# Patient Record
Sex: Female | Born: 1955 | Race: White | Hispanic: No | Smoking: Former smoker
Health system: Southern US, Community
[De-identification: ages and names within clinical notes are randomized; demographics above are authoritative.]

## PROBLEM LIST (undated history)

## (undated) DIAGNOSIS — E119 Type 2 diabetes mellitus without complications: Secondary | ICD-10-CM

## (undated) DIAGNOSIS — K635 Polyp of colon: Secondary | ICD-10-CM

## (undated) DIAGNOSIS — Z972 Presence of dental prosthetic device (complete) (partial): Secondary | ICD-10-CM

## (undated) DIAGNOSIS — I10 Essential (primary) hypertension: Secondary | ICD-10-CM

## (undated) DIAGNOSIS — E785 Hyperlipidemia, unspecified: Secondary | ICD-10-CM

## (undated) DIAGNOSIS — F419 Anxiety disorder, unspecified: Secondary | ICD-10-CM

## (undated) DIAGNOSIS — M1990 Unspecified osteoarthritis, unspecified site: Secondary | ICD-10-CM

## (undated) DIAGNOSIS — G4730 Sleep apnea, unspecified: Secondary | ICD-10-CM

## (undated) HISTORY — DX: Type 2 diabetes mellitus without complications: E11.9

## (undated) HISTORY — PX: HYSTERECTOMY ABDOMINAL WITH SALPINGECTOMY: SHX6725

## (undated) HISTORY — DX: Essential (primary) hypertension: I10

## (undated) HISTORY — DX: Hyperlipidemia, unspecified: E78.5

## (undated) HISTORY — DX: Anxiety disorder, unspecified: F41.9

---

## 2004-08-08 ENCOUNTER — Other Ambulatory Visit: Payer: Self-pay

## 2005-09-13 ENCOUNTER — Emergency Department: Payer: Self-pay | Admitting: Emergency Medicine

## 2007-07-05 ENCOUNTER — Emergency Department: Payer: Self-pay | Admitting: Emergency Medicine

## 2008-03-15 ENCOUNTER — Ambulatory Visit: Payer: Self-pay | Admitting: Family Medicine

## 2008-04-06 ENCOUNTER — Emergency Department: Payer: Self-pay | Admitting: Emergency Medicine

## 2009-06-22 ENCOUNTER — Emergency Department: Payer: Self-pay | Admitting: Internal Medicine

## 2009-10-11 ENCOUNTER — Ambulatory Visit: Payer: Self-pay | Admitting: General Practice

## 2010-03-18 ENCOUNTER — Ambulatory Visit: Payer: Self-pay | Admitting: Family Medicine

## 2010-07-21 ENCOUNTER — Emergency Department: Payer: Self-pay | Admitting: Emergency Medicine

## 2011-03-27 ENCOUNTER — Ambulatory Visit: Payer: Self-pay | Admitting: Family Medicine

## 2011-06-25 ENCOUNTER — Ambulatory Visit: Payer: Self-pay | Admitting: General Practice

## 2011-08-13 DIAGNOSIS — Z9079 Acquired absence of other genital organ(s): Secondary | ICD-10-CM

## 2011-08-13 DIAGNOSIS — F329 Major depressive disorder, single episode, unspecified: Secondary | ICD-10-CM | POA: Insufficient documentation

## 2011-08-13 DIAGNOSIS — J309 Allergic rhinitis, unspecified: Secondary | ICD-10-CM

## 2011-08-13 DIAGNOSIS — Z90722 Acquired absence of ovaries, bilateral: Secondary | ICD-10-CM

## 2011-08-13 DIAGNOSIS — E669 Obesity, unspecified: Secondary | ICD-10-CM | POA: Insufficient documentation

## 2011-08-13 DIAGNOSIS — Z90710 Acquired absence of both cervix and uterus: Secondary | ICD-10-CM | POA: Insufficient documentation

## 2011-08-13 DIAGNOSIS — H9319 Tinnitus, unspecified ear: Secondary | ICD-10-CM

## 2011-08-13 DIAGNOSIS — F32A Depression, unspecified: Secondary | ICD-10-CM | POA: Insufficient documentation

## 2011-08-13 DIAGNOSIS — E785 Hyperlipidemia, unspecified: Secondary | ICD-10-CM | POA: Insufficient documentation

## 2011-08-13 DIAGNOSIS — I10 Essential (primary) hypertension: Secondary | ICD-10-CM | POA: Insufficient documentation

## 2011-08-13 DIAGNOSIS — F419 Anxiety disorder, unspecified: Secondary | ICD-10-CM | POA: Insufficient documentation

## 2011-08-13 HISTORY — DX: Tinnitus, unspecified ear: H93.19

## 2011-08-13 HISTORY — DX: Acquired absence of both cervix and uterus: Z90.710

## 2011-08-13 HISTORY — DX: Allergic rhinitis, unspecified: J30.9

## 2012-02-19 DIAGNOSIS — E119 Type 2 diabetes mellitus without complications: Secondary | ICD-10-CM | POA: Insufficient documentation

## 2012-03-08 DIAGNOSIS — M25519 Pain in unspecified shoulder: Secondary | ICD-10-CM | POA: Insufficient documentation

## 2012-03-09 DIAGNOSIS — M7530 Calcific tendinitis of unspecified shoulder: Secondary | ICD-10-CM | POA: Insufficient documentation

## 2012-10-30 ENCOUNTER — Emergency Department: Payer: Self-pay | Admitting: Emergency Medicine

## 2012-10-30 LAB — RAPID INFLUENZA A&B ANTIGENS

## 2012-12-28 HISTORY — PX: COLPOSCOPY: SHX161

## 2013-05-16 DIAGNOSIS — S63501A Unspecified sprain of right wrist, initial encounter: Secondary | ICD-10-CM | POA: Insufficient documentation

## 2015-01-11 DIAGNOSIS — M67461 Ganglion, right knee: Secondary | ICD-10-CM | POA: Insufficient documentation

## 2015-01-11 HISTORY — DX: Ganglion, right knee: M67.461

## 2015-08-28 DIAGNOSIS — G4733 Obstructive sleep apnea (adult) (pediatric): Secondary | ICD-10-CM

## 2015-08-28 HISTORY — DX: Obstructive sleep apnea (adult) (pediatric): G47.33

## 2015-10-24 ENCOUNTER — Ambulatory Visit: Payer: Self-pay | Admitting: Physician Assistant

## 2015-10-24 VITALS — BP 110/70 | HR 82 | Temp 98.1°F

## 2015-10-24 DIAGNOSIS — B029 Zoster without complications: Secondary | ICD-10-CM

## 2015-10-24 MED ORDER — FAMCICLOVIR 500 MG PO TABS: 500.0000 mg | ORAL_TABLET | Freq: Three times a day (TID) | ORAL | Status: DC

## 2015-10-24 MED ORDER — METHYLPREDNISOLONE 4 MG PO TBPK: ORAL_TABLET | ORAL | Status: DC

## 2015-10-24 MED ORDER — HYDROCODONE-ACETAMINOPHEN 5-325 MG PO TABS: 1.0000 | ORAL_TABLET | Freq: Four times a day (QID) | ORAL | Status: DC | PRN

## 2015-10-24 NOTE — Patient Instructions (Signed)
Shingles Shingles is an infection that causes a painful skin rash and fluid-filled blisters. Shingles is caused by the same virus that causes chickenpox. Shingles only develops in people who:  Have had chickenpox.  Have gotten the chickenpox vaccine. (This is rare.) The first symptoms of shingles may be itching, tingling, or pain in an area on your skin. A rash will follow in a few days or weeks. The rash is usually on one side of the body in a bandlike or beltlike pattern. Over time, the rash turns into fluid-filled blisters that break open, scab over, and dry up. Medicines may:  Help you manage pain.  Help you recover more quickly.  Help to prevent long-term problems. HOME CARE Medicines  Take medicines only as told by your doctor.  Apply an anti-itch or numbing cream to the affected area as told by your doctor. Blister and Rash Care  Take a cool bath or put cool compresses on the area of the rash or blisters as told by your doctor. This may help with pain and itching.  Keep your rash covered with a loose bandage (dressing). Wear loose-fitting clothing.  Keep your rash and blisters clean with mild soap and cool water or as told by your doctor.  Check your rash every day for signs of infection. These include redness, swelling, and pain that lasts or gets worse.  Do not pick your blisters.  Do not scratch your rash. General Instructions  Rest as told by your doctor.  Keep all follow-up visits as told by your doctor. This is important.  Until your blisters scab over, your infection can cause chickenpox in people who have never had it or been vaccinated against it. To prevent this from happening, avoid touching other people or being around other people, especially:  Babies.  Pregnant women.  Children who have eczema.  Elderly people who have transplants.  People who have chronic illnesses, such as leukemia or AIDS. GET HELP IF:  Your pain does not get better with  medicine.  Your pain does not get better after the rash heals.  Your rash looks infected. Signs of infection include:  Redness.  Swelling.  Pain that lasts or gets worse. GET HELP RIGHT AWAY IF:  The rash is on your face or nose.  You have pain in your face, pain around your eye area, or loss of feeling on one side of your face.  You have ear pain or you have ringing in your ear.  You have loss of taste.  Your condition gets worse.   This information is not intended to replace advice given to you by your health care provider. Make sure you discuss any questions you have with your health care provider.   Document Released: 06/01/2008 Document Revised: 01/04/2015 Document Reviewed: 09/25/2014 Elsevier Interactive Patient Education 2016 [employer]  

## 2015-10-24 NOTE — Progress Notes (Signed)
S: c/o rash on left buttock, painful with blisters, noticed yesterday, no fever/chills, hx of chicken pox as a kid  O: vitals wnl, nad, skin with cluster of blisters on erythematous base on left buttock, no drainage, nv intact, lungs c t a, cv rrr  A: shingles  P: famvir 500mg  tid x7d, medrol dose pack, vicodin 5/325 #20 nr

## 2016-11-13 ENCOUNTER — Ambulatory Visit: Payer: Self-pay | Admitting: Registered Nurse

## 2016-11-13 ENCOUNTER — Encounter: Payer: Self-pay | Admitting: Registered Nurse

## 2016-11-13 ENCOUNTER — Other Ambulatory Visit: Payer: Self-pay | Admitting: Registered Nurse

## 2016-11-13 VITALS — BP 120/80 | HR 84 | Temp 98.8°F

## 2016-11-13 DIAGNOSIS — J209 Acute bronchitis, unspecified: Secondary | ICD-10-CM

## 2016-11-13 DIAGNOSIS — J038 Acute tonsillitis due to other specified organisms: Secondary | ICD-10-CM

## 2016-11-13 DIAGNOSIS — J019 Acute sinusitis, unspecified: Secondary | ICD-10-CM

## 2016-11-13 DIAGNOSIS — H6593 Unspecified nonsuppurative otitis media, bilateral: Secondary | ICD-10-CM

## 2016-11-13 MED ORDER — MOMETASONE FUROATE 50 MCG/ACT NA SUSP: 2.0000 | Freq: Every day | NASAL | 0 refills | Status: DC

## 2016-11-13 MED ORDER — IBUPROFEN 600 MG PO TABS: 600.0000 mg | ORAL_TABLET | Freq: Three times a day (TID) | ORAL | 0 refills | Status: DC | PRN

## 2016-11-13 MED ORDER — SALINE SPRAY 0.65 % NA SOLN: 1.0000 | NASAL | 0 refills | Status: DC | PRN

## 2016-11-13 MED ORDER — BENZONATATE 200 MG PO CAPS: 200.0000 mg | ORAL_CAPSULE | Freq: Three times a day (TID) | ORAL | 0 refills | Status: AC | PRN

## 2016-11-13 MED ORDER — AMOXICILLIN-POT CLAVULANATE 875-125 MG PO TABS: 1.0000 | ORAL_TABLET | Freq: Two times a day (BID) | ORAL | 0 refills | Status: AC

## 2016-11-13 MED ORDER — ACETAMINOPHEN 500 MG PO TABS: 1000.0000 mg | ORAL_TABLET | Freq: Four times a day (QID) | ORAL | 0 refills | Status: AC | PRN

## 2016-11-13 MED ORDER — FEXOFENADINE HCL 180 MG PO TABS: 180.0000 mg | ORAL_TABLET | Freq: Every day | ORAL | Status: DC

## 2016-11-13 NOTE — Progress Notes (Signed)
Supportive treatment.   No evidence of invasive bacterial infection, non toxic and well hydrated.  This is most likely self limiting viral infection.  I do not see where any further testing or imaging is necessary at this time.   I will suggest supportive care, rest, good hygiene and encourage the patient to take adequate fluids.  The patient is to return to clinic or EMERGENCY ROOM if symptoms worsen or change significantly e.g. ear pain, fever, purulent discharge from ears or bleeding.  Exitcare handout on otitis media with effusion given to patient.  Patient verbalized agreement and understanding of treatment plan.    Patient notified rapid strep negative.  Suspect Viral illness: no evidence of invasive bacterial infection, non toxic and well hydrated.  This is most likely self limiting viral infection.  I do not see where any further testing or imaging is necessary at this time.   I will suggest supportive care, rest, good hygiene and encourage the patient to take adequate fluids.  Does not require work excuse.  Notified patient staff will call with culture results once available next 48+ hours.  Sudafed 30mg  po q4-6h prn; flonase 1 spray each nostril BID prn, nasal saline 1-2 sprays each nostril prn q2h, motrin 800mg  po TID prn.  Discussed honey with lemon and salt water gargles for comfort also.  The patient is to return to clinic or EMERGENCY ROOM if symptoms worsen or change significantly e.g. fever, lethargy, SOB, wheezing.  Exitcare handout on viral illness given to patient.  Patient verbalized agreement and understanding of treatment plan.    No evidence of systemic bacterial infection, non toxic and well hydrated.  I do not see where any further testing or imaging is necessary at this time.   I will suggest supportive care, rest, good hygiene and encourage the patient to take adequate fluids.  The patient is to return to clinic or EMERGENCY ROOM if symptoms worsen or change significantly.  Exitcare  handout on sinusitis given to patient.  Patient verbalized agreement and understanding of treatment plan and had no further questions at this time.   P2:  Hand washing and cover cough  Restart flonase 1 spray each nostril BID, saline 2 sprays each nostril q2h prn congestion.  If no improvement with 48 hours of saline and flonase use start augmentin 875mg  po BID x 10 days.  Rx given.  No evidence of systemic bacterial infection, non toxic and well hydrated.  I do not see where any further testing or imaging is necessary at this time.   I will suggest supportive care, rest, good hygiene and encourage the patient to take adequate fluids.  The patient is to return to clinic or EMERGENCY ROOM if symptoms worsen or change significantly.  Exitcare handout on sinusitis given to patient.  Patient verbalized agreement and understanding of treatment plan and had no further questions at this time.   P2:  Hand washing and cover cough  Bronchitis simple, community acquired, may have started as viral (probably respiratory syncytial, parainfluenza, influenza, or adenovirus), but now evidence of acute purulent bronchitis with resultant bronchial edema and mucus formation.  Viruses are the most common cause of bronchial inflammation in otherwise healthy adults with acute bronchitis.  The appearance of sputum is not predictive of whether a bacterial infection is present.  Purulent sputum is most often caused by viral infections.  There are a small portion of those caused by non-viral agents being Mycoplamsa pneumonia.  Microscopic examination or C&S of sputum  in the healthy adult with acute bronchitis is generally not helpful (usually negative or normal respiratory flora) other considerations being cough from upper respiratory tract infections, sinusitis or allergic syndromes (mild asthma or viral pneumonia).  Differential Diagnosis:  reactive airway disease (asthma, allergic aspergillosis (eosinophilia), chronic bronchitis,  respiratory infection (Sinusitis, Common cold, pneumonia), congestive heart failure, reflux esophagitis, bronchogenic tumor, aspiration syndromes and/or exposure irritants/tobacco smoke.  In this case, there is no evidence of any invasive bacterial illness.  Most likely viral etiology so will hold on antibiotic treatment.  Advise supportive care with rest, encourage fluids, good hygiene and watch for any worsening symptoms.  If they were to develop:  come back to the office or go to the emergency room if after hours. Without high fever, severe dyspnea, lack of physical findings or other risk factors, I will hold on a chest radiograph and CBC at this time. I discussed that approximately 50% of patients with acute bronchitis have a cough that lasts up to three weeks, and 25% for over a month.  Tylenol, one to two tablets every four hours as needed for fever or myalgias.   No aspirin.  Patient instructed to follow up in one week or sooner if symptoms worsen. Patient verbalized agreement and understanding of treatment plan.  P2:  hand washing and cover cough  School/work excuse note given to patient for 24 hours.  Usually no specific medical treatment is needed if a virus is causing the sore throat.  The throat most often gets better on its own within 5 to 7 days.  Antibiotic medicine does not cure viral pharyngitis.   For acute pharyngitis caused by bacteria, your healthcare provider will prescribe an antibiotic.  Marland Kitchen Do not smoke.  Marland Kitchen Avoid secondhand smoke and other air pollutants.  . Use a cool mist humidifier to add moisture to the air.  . Get plenty of rest.  . You may want to rest your throat by talking less and eating a diet that is mostly liquid or soft for a day or two.   Marland Kitchen Nonprescription throat lozenges and mouthwashes should help relieve the soreness.   . Gargling with warm saltwater and drinking warm liquids may help.  (You can make a saltwater solution by adding 1/4 teaspoon of salt to 8 ounces,  or 240 mL, of warm water.)  . A nonprescription pain reliever such as aspirin, acetaminophen, or ibuprofen may ease general aches and pains.   FOLLOW UP with clinic provider if no improvements in the next 7-10 days.  Patient verbalized understanding of instructions and agreed with plan of care.

## 2016-11-13 NOTE — Progress Notes (Signed)
Subjective:    Patient ID: Katherine Francis, female    DOB: 1956/10/22, 60 y.o.   MRN: [id number]  Married caucasian female recently traveling returned home with cough, post nasal drip, sore throat, pressure behind eyes.  Stopped metformin 3 weeks ago along with citalopram states blood sugars on glucometer running 114 when last checked.  Denied fever/chills.  Productive cough green with blood tinged.  Ran out of mometasone nasal spray needs refill.  Has been trying dayquil and nyquil without much relief of symptoms.  Ear pressure.  Stated she usually takes her depression medications when at home spouse makes her depressed      Review of Systems  Constitutional: Positive for fatigue. Negative for activity change, appetite change, chills, diaphoresis, fever and unexpected weight change.  HENT: Positive for congestion, ear pain, nosebleeds, postnasal drip, rhinorrhea, sinus pain, sinus pressure and sore throat. Negative for dental problem, drooling, ear discharge, facial swelling, hearing loss, mouth sores, sneezing, tinnitus, trouble swallowing and voice change.   Eyes: Negative for photophobia, pain, discharge, redness, itching and visual disturbance.  Respiratory: Positive for cough. Negative for choking, chest tightness, shortness of breath, wheezing and stridor.   Cardiovascular: Negative for chest pain, palpitations and leg swelling.  Gastrointestinal: Negative for abdominal distention, abdominal pain, blood in stool, constipation, diarrhea, nausea and vomiting.  Endocrine: Negative for cold intolerance and heat intolerance.  Genitourinary: Negative for difficulty urinating, dysuria and hematuria.  Musculoskeletal: Negative for arthralgias, back pain, gait problem, joint swelling, myalgias, neck pain and neck stiffness.  Skin: Negative for color change, pallor, rash and wound.  Allergic/Immunologic: Positive for environmental allergies. Negative for food allergies.  Neurological: Positive  for headaches. Negative for dizziness, tremors, seizures, syncope, facial asymmetry, speech difficulty, weakness, light-headedness and numbness.  Hematological: Negative for adenopathy. Does not bruise/bleed easily.  Psychiatric/Behavioral: Positive for sleep disturbance. Negative for agitation, behavioral problems and confusion.       Objective:   Physical Exam  Constitutional: She is oriented to person, place, and time. Vital signs are normal. She appears well-developed and well-nourished. She is active and cooperative.  Non-toxic appearance. She does not have a sickly appearance. She appears ill. No distress.  HENT:  Head: Normocephalic and atraumatic.  Right Ear: Hearing, external ear and ear canal normal. A middle ear effusion is present.  Left Ear: Hearing, external ear and ear canal normal. A middle ear effusion is present.  Nose: Mucosal edema and rhinorrhea present. No nose lacerations, sinus tenderness, nasal deformity, septal deviation or nasal septal hematoma. No epistaxis.  No foreign bodies. Right sinus exhibits no maxillary sinus tenderness and no frontal sinus tenderness. Left sinus exhibits no maxillary sinus tenderness and no frontal sinus tenderness.  Mouth/Throat: Uvula is midline and mucous membranes are normal. Mucous membranes are not pale, not dry and not cyanotic. She does not have dentures. No oral lesions. No trismus in the jaw. Normal dentition. No dental abscesses, uvula swelling, lacerations or dental caries. Posterior oropharyngeal edema and posterior oropharyngeal erythema present. No oropharyngeal exudate or tonsillar abscesses.  Right tonsil edema 3+ touching uvula no exudate, cobblestoning posterior pharynx; bilateral nasal turbinates edema/erythema, clear discharge; bilateral allergic shiners, intermittent nonproductive cough; bilateral TMs air fluid level 50% opacity  Eyes: Conjunctivae, EOM and lids are normal. Pupils are equal, round, and reactive to light.  Right eye exhibits no chemosis, no discharge, no exudate and no hordeolum. No foreign body present in the right eye. Left eye exhibits no chemosis, no discharge, no exudate and no  hordeolum. No foreign body present in the left eye. Right conjunctiva is not injected. Right conjunctiva has no hemorrhage. Left conjunctiva is not injected. Left conjunctiva has no hemorrhage. No scleral icterus. Right eye exhibits normal extraocular motion and no nystagmus. Left eye exhibits normal extraocular motion and no nystagmus. Right pupil is round and reactive. Left pupil is round and reactive. Pupils are equal.  Neck: Trachea normal and normal range of motion. Neck supple. No tracheal tenderness, no spinous process tenderness and no muscular tenderness present. No neck rigidity. No tracheal deviation, no edema, no erythema and normal range of motion present. No thyroid mass and no thyromegaly present.  Cardiovascular: Normal rate, regular rhythm, S1 normal, S2 normal, normal heart sounds and intact distal pulses.  PMI is not displaced.  Exam reveals no gallop and no friction rub.   No murmur heard. Pulmonary/Chest: Effort normal and breath sounds normal. No accessory muscle usage or stridor. No respiratory distress. She has no decreased breath sounds. She has no wheezes. She has no rhonchi. She has no rales. She exhibits no tenderness.  Abdominal: Soft. Normal appearance. She exhibits no distension.  Musculoskeletal: Normal range of motion. She exhibits no edema or tenderness.       Right shoulder: Normal.       Left shoulder: Normal.       Right hip: Normal.       Left hip: Normal.       Right knee: Normal.       Left knee: Normal.       Cervical back: Normal.       Right hand: Normal.       Left hand: Normal.  Lymphadenopathy:       Head (right side): No submental, no submandibular, no tonsillar, no preauricular, no posterior auricular and no occipital adenopathy present.       Head (left side): No submental,  no submandibular, no tonsillar, no preauricular, no posterior auricular and no occipital adenopathy present.    She has no cervical adenopathy.       Right cervical: No superficial cervical, no deep cervical and no posterior cervical adenopathy present.      Left cervical: No superficial cervical, no deep cervical and no posterior cervical adenopathy present.  Neurological: She is alert and oriented to person, place, and time. She has normal strength. She is not disoriented. She displays no atrophy and no tremor. No cranial nerve deficit or sensory deficit. She exhibits normal muscle tone. She displays no seizure activity. Coordination and gait normal. GCS eye subscore is 4. GCS verbal subscore is 5. GCS motor subscore is 6.  Skin: Skin is warm, dry and intact. No abrasion, no bruising, no burn, no ecchymosis, no laceration, no lesion, no petechiae and no rash noted. She is not diaphoretic. No cyanosis or erythema. No pallor. Nails show no clubbing.  Psychiatric: She has a normal mood and affect. Her speech is normal and behavior is normal. Judgment and thought content normal. Cognition and memory are normal.  Nursing note and vitals reviewed.         Assessment & Plan:  A-acutre rhinosinusitis, bilateral otitis media effusion, acute bronchitis viral  P-augmentin 875mg  po BID x 10 days; restart mometasone 2 sprays each nostril daily, tessalon pearles 200mg  po TID prn cough, tylenol 1000mg  po QID prn pain/fever, restart allegra 180mg  po daily at home.  Aggressive use nasal saline 2 sprays each nostril q2h prn congestion.   Patient may use normal saline nasal spray  as needed.  Consider antihistamine or nasal steroid use.  Avoid triggers if possible.  Shower prior to bedtime if exposed to triggers.  If allergic dust/dust mites recommend mattress/pillow covers/encasements; washing linens, vacuuming, sweeping, dusting weekly.  Call or return to clinic as needed if these symptoms worsen or fail to  improve as anticipated.   Patient verbalized understanding of instructions, agreed with plan of care and had no further questions at this time.  P2:  Avoidance and hand washing.  Supportive treatment.   No evidence of invasive bacterial infection, non toxic and well hydrated.  This is most likely self limiting viral infection.  I do not see where any further testing or imaging is necessary at this time.   I will suggest supportive care, rest, good hygiene and encourage the patient to take adequate fluids.  The patient is to return to clinic or EMERGENCY ROOM if symptoms worsen or change significantly e.g. ear pain, fever, purulent discharge from ears or bleeding.   Patient verbalized agreement and understanding of treatment plan.      Suspect Viral illness: no evidence of invasive bacterial infection, non toxic and well hydrated.  This is most likely self limiting viral infection.  I do not see where any further testing or imaging is necessary at this time.   I will suggest supportive care, rest, good hygiene and encourage the patient to take adequate fluids.  Does not require work excuse.   nasal saline 1-2 sprays each nostril prn q2h, motrin 800mg  po TID prn.  Discussed honey with lemon and salt water gargles for comfort also.  The patient is to return to clinic or EMERGENCY ROOM if symptoms worsen or change significantly e.g. fever, lethargy, SOB, wheezing.    Patient verbalized agreement and understanding of treatment plan.    Restart mometasone 2 sprays each nostril daily (refilled Rx), saline 2 sprays each nostril q2h prn congestion.  If no improvement with 48 hours of saline and mometasone use start augmentin 875mg  po BID x 10 days.  Rx given.  No evidence of systemic bacterial infection, non toxic and well hydrated.  I do not see where any further testing or imaging is necessary at this time.   I will suggest supportive care, rest, good hygiene and encourage the patient to take adequate fluids.  The  patient is to return to clinic or EMERGENCY ROOM if symptoms worsen or change significantly.   Patient verbalized agreement and understanding of treatment plan and had no further questions at this time.   P2:  Hand washing and cover cough  Tessalon pearles 200mg  po TID prn cough.  Augmentin Rx for pharyngitis/sinusitis will cover for bronchitis also.  Bronchitis simple, community acquired, may have started as viral (probably respiratory syncytial, parainfluenza, influenza, or adenovirus), but now evidence of acute purulent bronchitis with resultant bronchial edema and mucus formation.  Viruses are the most common cause of bronchial inflammation in otherwise healthy adults with acute bronchitis.  The appearance of sputum is not predictive of whether a bacterial infection is present.  Purulent sputum is most often caused by viral infections.  There are a small portion of those caused by non-viral agents being Mycoplamsa pneumonia.  Microscopic examination or C&S of sputum in the healthy adult with acute bronchitis is generally not helpful (usually negative or normal respiratory flora) other considerations being cough from upper respiratory tract infections, sinusitis or allergic syndromes (mild asthma or viral pneumonia).  Differential Diagnosis:  reactive airway disease (asthma, allergic aspergillosis (eosinophilia),  chronic bronchitis, respiratory infection (Sinusitis, Common cold, pneumonia), congestive heart failure, reflux esophagitis, bronchogenic tumor, aspiration syndromes and/or exposure irritants/tobacco smoke.  In this case, there is no evidence of any invasive bacterial illness.  Most likely viral etiology so will hold on antibiotic treatment.  Advise supportive care with rest, encourage fluids, good hygiene and watch for any worsening symptoms.  If they were to develop:  come back to the office or go to the emergency room if after hours. Without high fever, severe dyspnea, lack of physical findings or  other risk factors, I will hold on a chest radiograph and CBC at this time. I discussed that approximately 50% of patients with acute bronchitis have a cough that lasts up to three weeks, and 25% for over a month.  Tylenol, one to two tablets every four hours as needed for fever or myalgias.   No aspirin.  Patient instructed to follow up in one week or sooner if symptoms worsen. Patient verbalized agreement and understanding of treatment plan.  P2:  hand washing and cover cough  Did not require work excuse.  Rx augmentin 875mg  po BID x 10 days will cover for strep throat.  Patient has tylenol and motrin at home for prn use.  Usually no specific medical treatment is needed if a virus is causing the sore throat.  The throat most often gets better on its own within 5 to 7 days.  Antibiotic medicine does not cure viral pharyngitis.   For acute pharyngitis caused by bacteria, your healthcare provider will prescribe an antibiotic.  Marland Kitchen Do not smoke.  Marland Kitchen Avoid secondhand smoke and other air pollutants.  . Use a cool mist humidifier to add moisture to the air.  . Get plenty of rest.  . You may want to rest your throat by talking less and eating a diet that is mostly liquid or soft for a day or two.   Marland Kitchen Nonprescription throat lozenges and mouthwashes should help relieve the soreness.   . Gargling with warm saltwater and drinking warm liquids may help.  (You can make a saltwater solution by adding 1/4 teaspoon of salt to 8 ounces, or 240 mL, of warm water.)  . A nonprescription pain reliever such as aspirin, acetaminophen, or ibuprofen may ease general aches and pains.   FOLLOW UP with clinic provider if no improvements in the next 7-10 days.  Patient verbalized understanding of instructions and agreed with plan of care.

## 2016-11-30 ENCOUNTER — Other Ambulatory Visit: Payer: Self-pay | Admitting: Family Medicine

## 2016-11-30 DIAGNOSIS — Z1239 Encounter for other screening for malignant neoplasm of breast: Secondary | ICD-10-CM

## 2016-12-01 ENCOUNTER — Ambulatory Visit: Payer: Self-pay | Admitting: Physician Assistant

## 2016-12-01 VITALS — BP 131/79 | HR 75 | Temp 97.9°F

## 2016-12-01 DIAGNOSIS — J069 Acute upper respiratory infection, unspecified: Secondary | ICD-10-CM

## 2016-12-01 MED ORDER — AZITHROMYCIN 250 MG PO TABS: ORAL_TABLET | ORAL | 0 refills | Status: DC

## 2016-12-01 MED ORDER — PREDNISONE 10 MG PO TABS: 30.0000 mg | ORAL_TABLET | Freq: Every day | ORAL | 0 refills | Status: DC

## 2016-12-01 NOTE — Progress Notes (Signed)
S: C/o runny nose, sore throat, and congestion for 2 weeks, no fever, chills, cp/sob, v/d; mucus is green and thick, cough is sporadic, c/o of facial and dental pain. Also some headache and neck pain, finished amoxil but doesn't think it really worked, usually does well with zpacks  Using otc meds:   O: PE: vitals wnl, nad,  perrl eomi, normocephalic, tms dull, nasal mucosa red and swollen, throat injected, neck supple no lymph, lungs c t a, cv rrr, neuro intact  A:  Acute sinusitis   P: drink fluids, continue regular meds , use otc meds of choice, return if not improving in 5 days, return earlier if worsening, zpack, pred 30 mg qd x 3d,

## 2016-12-31 ENCOUNTER — Other Ambulatory Visit: Payer: Self-pay | Admitting: Emergency Medicine

## 2016-12-31 MED ORDER — MOMETASONE FUROATE 50 MCG/ACT NA SUSP: 2.0000 | Freq: Every day | NASAL | 6 refills | Status: DC

## 2016-12-31 NOTE — Telephone Encounter (Signed)
Med refill for nasonex approved

## 2018-03-21 ENCOUNTER — Encounter: Payer: Self-pay | Admitting: Obstetrics and Gynecology

## 2018-03-21 ENCOUNTER — Ambulatory Visit (INDEPENDENT_AMBULATORY_CARE_PROVIDER_SITE_OTHER): Payer: Managed Care, Other (non HMO) | Admitting: Obstetrics and Gynecology

## 2018-03-21 VITALS — BP 140/88 | HR 95 | Ht 62.0 in | Wt 174.0 lb

## 2018-03-21 DIAGNOSIS — N898 Other specified noninflammatory disorders of vagina: Secondary | ICD-10-CM

## 2018-03-21 DIAGNOSIS — B356 Tinea cruris: Secondary | ICD-10-CM | POA: Diagnosis not present

## 2018-03-21 LAB — POCT WET PREP WITH KOH
Clue Cells Wet Prep HPF POC: NEGATIVE
KOH PREP POC: NEGATIVE
Trichomonas, UA: NEGATIVE
Yeast Wet Prep HPF POC: NEGATIVE

## 2018-03-21 MED ORDER — CLOTRIMAZOLE-BETAMETHASONE 1-0.05 % EX CREA: 1.0000 "application " | TOPICAL_CREAM | Freq: Two times a day (BID) | CUTANEOUS | 0 refills | Status: DC

## 2018-03-21 NOTE — Progress Notes (Signed)
Patient, No Pcp Per   Chief Complaint  Patient presents with  . Vaginitis    itching, burning and tingling in leg creases     HPI:      [name] is a 62 y.o. No obstetric history on file. who LMP was No LMP recorded. Patient is postmenopausal., presents today for NP eval of vaginal itching and burning for several months. She notices it in crease of inner thighs and rectal area, as well as clitoral hood. She has treated with OTC meds without relief, as well as a little triamcinolone crm with some relief. Also notes increased vag d/c, no odor. She has a hx of DM. Used to be on meds but stopped them after wt loss. Pt states she has gained wt back and blood sugars elevated again.    Past Medical History:  Diagnosis Date  . Anxiety   . Diabetes mellitus without complication ([place])   . Hyperlipidemia   . Hypertension     Past Surgical History:  Procedure Laterality Date  . CESAREAN SECTION    . COLPOSCOPY  2014  . HYSTERECTOMY ABDOMINAL WITH SALPINGECTOMY      Family History  Problem Relation Age of Onset  . Uterine cancer Mother   . Colon cancer Brother   . Lung cancer Brother   . Skin cancer Brother     Social History   Socioeconomic History  . Marital status: Married    Spouse name: Not on file  . Number of children: Not on file  . Years of education: Not on file  . Highest education level: Not on file  Occupational History  . Not on file  Social Needs  . Financial resource strain: Not on file  . Food insecurity:    Worry: Not on file    Inability: Not on file  . Transportation needs:    Medical: Not on file    Non-medical: Not on file  Tobacco Use  . Smoking status: Former Research scientist (life sciences)  . Smokeless tobacco: Never Used  Substance and Sexual Activity  . Alcohol use: Not Currently  . Drug use: Never  . Sexual activity: Not Currently    Birth control/protection: None  Lifestyle  . Physical activity:    Days per week: Not on file    Minutes per  session: Not on file  . Stress: Not on file  Relationships  . Social connections:    Talks on phone: Not on file    Gets together: Not on file    Attends religious service: Not on file    Active member of club or organization: Not on file    Attends meetings of clubs or organizations: Not on file    Relationship status: Not on file  . Intimate partner violence:    Fear of current or ex partner: Not on file    Emotionally abused: Not on file    Physically abused: Not on file    Forced sexual activity: Not on file  Other Topics Concern  . Not on file  Social History Narrative  . Not on file    Outpatient Medications Prior to Visit  Medication Sig Dispense Refill  . atorvastatin (LIPITOR) 40 MG tablet TAKE 1 TABLET BY MOUTH EVERY DAY    . enalapril-hydrochlorothiazide (VASERETIC) 10-25 MG tablet Take by mouth.    Marland Kitchen ibuprofen (ADVIL,MOTRIN) 600 MG tablet Take 1 tablet (600 mg total) by mouth every 8 (eight) hours as needed. 30 tablet 0  .  mometasone (NASONEX) 50 MCG/ACT nasal spray Place 2 sprays into the nose daily. 17 g 6  . ONE TOUCH ULTRA TEST test strip Apply 1 strip topically 3 (three) times daily as needed.  11  . ONETOUCH DELICA LANCETS 02D MISC USE 1 EACH 3 (THREE) TIMES DAILY. USE AS INSTRUCTED.ONE TOUCH ULTRA METER DX CODE E11.9 (LIFESCAN)    . valACYclovir (VALTREX) 500 MG tablet Take by mouth.    . ALPRAZolam (XANAX) 0.25 MG tablet One every eight hours as needed for anxiety    . azithromycin (ZITHROMAX Z-PAK) 250 MG tablet 2 pills today then 1 pill a day for 4 days (Patient not taking: Reported on 03/21/2018) 6 each 0  . citalopram (CELEXA) 20 MG tablet Take 30 mg by mouth daily.    . predniSONE (DELTASONE) 10 MG tablet Take 3 tablets (30 mg total) by mouth daily with breakfast. (Patient not taking: Reported on 03/21/2018) 9 tablet 0  . atorvastatin (LIPITOR) 20 MG tablet Take by mouth.     No facility-administered medications prior to visit.       ROS:  Review of  Systems  Constitutional: Negative for fever.  Gastrointestinal: Negative for blood in stool, constipation, diarrhea, nausea and vomiting.  Genitourinary: Positive for frequency, vaginal discharge and vaginal pain. Negative for dyspareunia, dysuria, flank pain, hematuria, urgency and vaginal bleeding.  Musculoskeletal: Negative for back pain.  Skin: Negative for rash.   BREAST: No symptoms   OBJECTIVE:   Vitals:  BP 140/88   Pulse 95   Ht 5\' 2"  (1.575 m)   Wt 174 lb (78.9 kg)   BMI 31.83 kg/m   Physical Exam  Constitutional: She is oriented to person, place, and time and well-developed, well-nourished, and in no distress. Vital signs are normal.  Genitourinary: Vagina normal, uterus normal, cervix normal, right adnexa normal and left adnexa normal. Uterus is not enlarged. Cervix exhibits no motion tenderness and no tenderness. Right adnexum displays no mass and no tenderness. Left adnexum displays no mass and no tenderness. Vulva exhibits erythema. Vulva exhibits no exudate, no lesion, no rash and no tenderness. Vagina exhibits no lesion.  Genitourinary Comments: CLITORAL HOOD WITH HYPERTROPHY/IRRITATION/YEAST D/C; BILAT ING AREAS WITH ERYTHEMA/SCALE/PAPULES; PERIANAL AREA WITH FISSURES IN CREASES  Neurological: She is oriented to person, place, and time.  Skin: Skin is warm and dry. Rash noted. Rash is macular.  Vitals reviewed.   Results: Results for orders placed or performed in visit on 03/21/18 (from the past 24 hour(s))  POCT Wet Prep with KOH     Status: Normal   Collection Time: 03/21/18  2:52 PM  Result Value Ref Range   Trichomonas, UA Negative    Clue Cells Wet Prep HPF POC neg    Epithelial Wet Prep HPF POC  Few, Moderate, Many, Too numerous to count   Yeast Wet Prep HPF POC neg    Bacteria Wet Prep HPF POC  Few   RBC Wet Prep HPF POC     WBC Wet Prep HPF POC     KOH Prep POC Negative Negative     Assessment/Plan: Tinea cruris - Rx lotrisone crm BID for 2 wks.  RTO in 2 wks if still sx. If resolved, cancel appt. Restart DM Rx with PCP. Keep area dry. F/u prn.  - Plan: clotrimazole-betamethasone (LOTRISONE) cream  Vaginal discharge - Neg wet prep/exam. Reassurance. - Plan: POCT Wet Prep with KOH    Meds ordered this encounter  Medications  . clotrimazole-betamethasone (LOTRISONE) cream  Sig: Apply 1 application topically 2 (two) times daily. Apply externally BID sx for 2 wks    Dispense:  45 g    Refill:  0    Order Specific Question:   Supervising Provider    Answer:   Gae Dry [[id number]]     Return in about 2 weeks (around 04/04/2018) for vag f/u.  Blair Mesina B. Waylen Depaolo, PA-C 03/21/2018 2:52 PM

## 2018-03-21 NOTE — Patient Instructions (Signed)
I value your feedback and entrusting us with your care. If you get a [facility] patient survey, I would appreciate you taking the time to let us know about your experience today. Thank you! 

## 2018-04-04 ENCOUNTER — Ambulatory Visit: Payer: Managed Care, Other (non HMO) | Admitting: Obstetrics and Gynecology

## 2018-07-04 ENCOUNTER — Telehealth: Payer: Self-pay

## 2018-07-04 ENCOUNTER — Other Ambulatory Visit: Payer: Self-pay | Admitting: Obstetrics and Gynecology

## 2018-07-04 DIAGNOSIS — B356 Tinea cruris: Secondary | ICD-10-CM

## 2018-07-04 MED ORDER — CLOTRIMAZOLE-BETAMETHASONE 1-0.05 % EX CREA: 1.0000 "application " | TOPICAL_CREAM | Freq: Two times a day (BID) | CUTANEOUS | 0 refills | Status: DC

## 2018-07-04 NOTE — Telephone Encounter (Signed)
Tried to call pt, vm full and was not able to leave message

## 2018-07-04 NOTE — Progress Notes (Signed)
Rx RF. RTO if sx persist.

## 2018-07-04 NOTE — Telephone Encounter (Signed)
Rx Rf. RN to notify pt if sx don't get better, she needs to RTO.

## 2018-07-04 NOTE — Telephone Encounter (Signed)
[facility] rx'd clotrimazole-betamethasone (LOTRISONE) cream. Pt requesting a refill. [phone]

## 2018-07-05 NOTE — Telephone Encounter (Signed)
Patient is returning the call.  She can be reached at [phone].

## 2018-07-05 NOTE — Telephone Encounter (Signed)
Pt aware.

## 2019-09-11 ENCOUNTER — Other Ambulatory Visit: Payer: Self-pay

## 2019-09-11 ENCOUNTER — Encounter: Payer: Self-pay | Admitting: Adult Health

## 2019-09-11 ENCOUNTER — Ambulatory Visit: Payer: Managed Care, Other (non HMO) | Admitting: Adult Health

## 2019-09-11 VITALS — BP 122/80 | HR 94 | Temp 97.1°F | Resp 18

## 2019-09-11 DIAGNOSIS — Z5329 Procedure and treatment not carried out because of patient's decision for other reasons: Secondary | ICD-10-CM

## 2019-09-11 NOTE — Patient Instructions (Signed)
Emergency room now recommend transportation by EMS now gven symptoms.   Shortness of Breath, Adult Shortness of breath means you have trouble breathing. Shortness of breath could be a sign of a medical problem. Follow these instructions at home:   Watch for any changes in your symptoms.  Do not use any products that contain nicotine or tobacco, such as cigarettes, e-cigarettes, and chewing tobacco.  Do not smoke. Smoking can cause shortness of breath. If you need help to quit smoking, ask your doctor.  Avoid things that can make it harder to breathe, such as: ? Mold. ? Dust. ? Air pollution. ? Chemical smells. ? Things that can cause allergy symptoms (allergens), if you have allergies.  Keep your living space clean. Use products that help remove mold and dust.  Rest as needed. Slowly return to your normal activities.  Take over-the-counter and prescription medicines only as told by your doctor. This includes oxygen therapy and inhaled medicines.  Keep all follow-up visits as told by your doctor. This is important. Contact a doctor if:  Your condition does not get better as soon as expected.  You have a hard time doing your normal activities, even after you rest.  You have new symptoms. Get help right away if:  Your shortness of breath gets worse.  You have trouble breathing when you are resting.  You feel light-headed or you pass out (faint).  You have a cough that is not helped by medicines.  You cough up blood.  You have pain with breathing.  You have pain in your chest, arms, shoulders, or belly (abdomen).  You have a fever.  You cannot walk up stairs.  You cannot exercise the way you normally do. These symptoms may represent a serious problem that is an emergency. Do not wait to see if the symptoms will go away. Get medical help right away. Call your local emergency services (911 in the [place] Do not drive yourself to the hospital. Summary  Shortness of  breath is when you have trouble breathing enough air. It can be a sign of a medical problem.  Avoid things that make it hard for you to breathe, such as smoking, pollution, mold, and dust.  Watch for any changes in your symptoms. Contact your doctor if you do not get better or you get worse. This information is not intended to replace advice given to you by your health care provider. Make sure you discuss any questions you have with your health care provider. Document Released: 06/01/2008 Document Revised: 05/16/2018 Document Reviewed: 05/16/2018 Elsevier Patient Education  2020 [employer].

## 2019-09-11 NOTE — Progress Notes (Signed)
   Patient was screened for Covid like symptoms on phone this morning and reported she has no symptoms of Covid.  Upon arrival to clinic she reports upper abdominal pain more upper right with shortness of breath as well as gastrointestinal symptoms. She has had episode of lightheadedness this morning.  She has had a rash left posterior shoulder that is painful. She also has left arm pain " could not sleep on arm last night "  Vital signs normal. She is in no acute distress.  Due to office protocol with Covid 19 pandemic we are unable to see Covid like symptoms in the clinic.  No exam performed as vitals stable and patient appears stable. Sitting on table.  Patient moves on and off of exam table and in room without difficulty. Gait is normal in hall and in room. Patient is oriented to person place time and situation. Patient answers questions appropriately and engages in conversation. She is advised she needs to be seen in the emergency room now for evaluation of symptoms. She declinies EMS transporattion as recommended by provider.  She prefers to self drive, risks versus benefits were discussed and patient still prefers to self drive against medical advice.  Patient verbalized understanding of all instructions given and denies any further questions at this time.

## 2019-10-05 ENCOUNTER — Ambulatory Visit: Payer: Managed Care, Other (non HMO)

## 2019-10-10 ENCOUNTER — Other Ambulatory Visit: Payer: Self-pay

## 2019-10-10 ENCOUNTER — Ambulatory Visit: Payer: Managed Care, Other (non HMO)

## 2019-10-10 DIAGNOSIS — Z23 Encounter for immunization: Secondary | ICD-10-CM

## 2019-10-10 DIAGNOSIS — Z008 Encounter for other general examination: Secondary | ICD-10-CM

## 2020-05-31 DIAGNOSIS — F330 Major depressive disorder, recurrent, mild: Secondary | ICD-10-CM

## 2020-05-31 DIAGNOSIS — H1013 Acute atopic conjunctivitis, bilateral: Secondary | ICD-10-CM | POA: Insufficient documentation

## 2020-05-31 HISTORY — DX: Major depressive disorder, recurrent, mild: F33.0

## 2020-06-03 ENCOUNTER — Other Ambulatory Visit: Payer: Self-pay | Admitting: Family Medicine

## 2020-06-03 DIAGNOSIS — Z1231 Encounter for screening mammogram for malignant neoplasm of breast: Secondary | ICD-10-CM

## 2020-09-02 ENCOUNTER — Emergency Department
Admission: EM | Admit: 2020-09-02 | Discharge: 2020-09-02 | Disposition: A | Discharge status: Home / Self Care | Payer: Managed Care, Other (non HMO) | Attending: Emergency Medicine | Admitting: Emergency Medicine

## 2020-09-02 ENCOUNTER — Other Ambulatory Visit: Payer: Self-pay

## 2020-09-02 ENCOUNTER — Emergency Department: Payer: Managed Care, Other (non HMO)

## 2020-09-02 ENCOUNTER — Encounter: Payer: Self-pay | Admitting: Emergency Medicine

## 2020-09-02 DIAGNOSIS — Z79899 Other long term (current) drug therapy: Secondary | ICD-10-CM | POA: Diagnosis not present

## 2020-09-02 DIAGNOSIS — I10 Essential (primary) hypertension: Secondary | ICD-10-CM | POA: Diagnosis not present

## 2020-09-02 DIAGNOSIS — E119 Type 2 diabetes mellitus without complications: Secondary | ICD-10-CM | POA: Diagnosis not present

## 2020-09-02 DIAGNOSIS — Z87891 Personal history of nicotine dependence: Secondary | ICD-10-CM | POA: Diagnosis not present

## 2020-09-02 DIAGNOSIS — Z7984 Long term (current) use of oral hypoglycemic drugs: Secondary | ICD-10-CM | POA: Insufficient documentation

## 2020-09-02 DIAGNOSIS — U071 COVID-19: Secondary | ICD-10-CM | POA: Diagnosis not present

## 2020-09-02 MED ORDER — PREDNISONE 20 MG PO TABS
60.0000 mg | ORAL_TABLET | Freq: Once | ORAL | Status: AC
  Administered 2020-09-02: 60 mg via ORAL
  Filled 2020-09-02: qty 3

## 2020-09-02 MED ORDER — ONDANSETRON 4 MG PO TBDP
4.0000 mg | ORAL_TABLET | Freq: Once | ORAL | Status: AC
  Administered 2020-09-02: 4 mg via ORAL
  Filled 2020-09-02: qty 1

## 2020-09-02 MED ORDER — ALBUTEROL SULFATE HFA 108 (90 BASE) MCG/ACT IN AERS: 2.0000 | INHALATION_SPRAY | Freq: Four times a day (QID) | RESPIRATORY_TRACT | 0 refills | Status: DC | PRN

## 2020-09-02 MED ORDER — PREDNISONE 20 MG PO TABS: 40.0000 mg | ORAL_TABLET | Freq: Every day | ORAL | 0 refills | Status: AC

## 2020-09-02 NOTE — ED Notes (Signed)
See triage note, pt reports diagnosed COVID + 7 days ago. Reports chest tightness that started this morning with increased SHOB.  Reports pain with inspiration.  Pt in NAD, ambulatory to treatment room. Speaking in complete sentences.

## 2020-09-02 NOTE — ED Provider Notes (Signed)
[facility] Emergency Department Provider Note ____________________________________________  Time seen: 1652  I have reviewed the triage vital signs and the nursing notes.  HISTORY  Chief Complaint  Covid Positive  HPI Katherine Francis is a 64 y.o. female with a history of diabetes, hypertension, obesity and OSA, presents herself to the ED for evaluation of symptoms related to her recent Covid diagnosis.  Patient was reportedly diagnosed almost a week ago, by her PCP.  Since that time she is been taking Tylenol and Motrin for symptom relief.  Her primary symptoms include body aches and some shortness of breath with talking.  She denies any chest pain, vomiting, diarrhea.  She does report loss of taste/smell sensation, and nausea.   Past Medical History:  Diagnosis Date   Anxiety    Diabetes mellitus without complication ([place])    Hyperlipidemia    Hypertension     Patient Active Problem List   Diagnosis Date Noted   OSA (obstructive sleep apnea) 08/28/2015   Ganglion of knee, right 01/11/2015   Sprain of wrist, right 05/16/2013   Tendonitis, calcific, shoulder 03/09/2012   Shoulder pain 03/08/2012   DM type 2 (diabetes mellitus, type 2) ([place]) 02/19/2012   Allergic rhinitis 08/13/2011   Anxiety 08/13/2011   Depression 08/13/2011   HTN (hypertension) 08/13/2011   Hyperlipidemia, unspecified 08/13/2011   Obesity 08/13/2011   S/P TAH-BSO 08/13/2011   Tinnitus 08/13/2011   Hyperlipidemia 08/13/2011    Past Surgical History:  Procedure Laterality Date   CESAREAN SECTION     COLPOSCOPY  2014   HYSTERECTOMY ABDOMINAL WITH SALPINGECTOMY      Prior to Admission medications   Medication Sig Start Date End Date Taking? Authorizing Provider  albuterol (VENTOLIN HFA) 108 (90 Base) MCG/ACT inhaler Inhale 2 puffs into the lungs every 6 (six) hours as needed for shortness of breath. 09/02/20   Jannette Cotham, Dannielle Karvonen, PA-C  ALPRAZolam Duanne Moron)  0.25 MG tablet One every eight hours as needed for anxiety 03/01/14   [provider]  atorvastatin (LIPITOR) 40 MG tablet TAKE 1 TABLET BY MOUTH EVERY DAY 03/19/18   [provider]  citalopram (CELEXA) 20 MG tablet Take 30 mg by mouth daily. 12/25/15 12/24/16  [provider]  clotrimazole-betamethasone (LOTRISONE) cream Apply 1 application topically 2 (two) times daily. Apply externally BID sx for 2 wks 6/0/10   Copland, Elmo Putt B, PA-C  cyclobenzaprine (FLEXERIL) 5 MG tablet TAKE 1 TABLET BY MOUTH 3 (THREE) TIMES DAILY AS NEEDED FOR MUSCLE SPASMS FOR UP TO 10 DAYS 05/04/19   [provider]  enalapril-hydrochlorothiazide (VASERETIC) 10-25 MG tablet Take by mouth. 10/04/17   [provider]  ibuprofen (ADVIL,MOTRIN) 600 MG tablet Take 1 tablet (600 mg total) by mouth every 8 (eight) hours as needed. 11/13/16   Betancourt, Aura Fey, NP  metFORMIN (GLUCOPHAGE) 500 MG tablet Take by mouth. 08/09/19   [provider]  mometasone (NASONEX) 50 MCG/ACT nasal spray Place 2 sprays into the nose daily. 12/31/16   Fisher, Linden Dolin, PA-C  ONE TOUCH ULTRA TEST test strip Apply 1 strip topically 3 (three) times daily as needed. 10/08/16   [provider]  predniSONE (DELTASONE) 20 MG tablet Take 2 tablets (40 mg total) by mouth daily with breakfast for 5 days. 09/02/20 09/07/20  Kazimierz Springborn, Dannielle Karvonen, PA-C    Allergies Prednisone  Family History  Problem Relation Age of Onset   Uterine cancer Mother    Colon cancer Brother    Lung  cancer Brother    Skin cancer Brother     Social History Social History   Tobacco Use   Smoking status: Former Smoker   Smokeless tobacco: Never Used  Scientific laboratory technician Use: Never used  Substance Use Topics   Alcohol use: Not Currently   Drug use: Never    Review of Systems  Constitutional: Negative for fever. Eyes: Negative for visual changes. ENT: Negative for sore throat. Cardiovascular: Negative  for chest pain. Respiratory: Positive for shortness of breath.  Gastrointestinal: Negative for abdominal pain, vomiting and diarrhea. Genitourinary: Negative for dysuria. Musculoskeletal: Negative for back pain.  Reports generalized body aches. Skin: Negative for rash. Neurological: Negative for headaches, focal weakness or numbness. ____________________________________________  PHYSICAL EXAM:  VITAL SIGNS: ED Triage Vitals  Enc Vitals Group     BP 09/02/20 1618 113/78     Pulse Rate 09/02/20 1618 83     Resp 09/02/20 1618 18     Temp 09/02/20 1618 99.3 F (37.4 C)     Temp Source 09/02/20 1618 Oral     SpO2 09/02/20 1618 97 %     Weight 09/02/20 1619 173 lb 15.1 oz (78.9 kg)     Height 09/02/20 1619 5\' 2"  (1.575 m)     Head Circumference --      Peak Flow --      Pain Score 09/02/20 1619 0     Pain Loc --      Pain Edu? --      Excl. in [place]? --     Constitutional: Alert and oriented. Well appearing and in no distress. Head: Normocephalic and atraumatic. Eyes: Conjunctivae are normal. Normal extraocular movements Cardiovascular: Normal rate, regular rhythm. Normal distal pulses. Respiratory: Normal respiratory effort. No wheezes/rales/rhonchi. Gastrointestinal: Soft and nontender. No distention. Musculoskeletal: Nontender with normal range of motion in all extremities.  Neurologic:  Normal gait without ataxia. Normal speech and language. No gross focal neurologic deficits are appreciated. Skin:  Skin is warm, dry and intact. No rash noted. Psychiatric: Mood and affect are normal. Patient exhibits appropriate insight and judgment. ___________________________________________  EKG  See EKG report ____________________________________________   RADIOLOGY  CXR IMPRESSION: 1. Minimal patchy densities in both lower lung zones, suspicious for early changes of COVID pneumonia/pneumonitis. 2. Minimal bronchitic  changes. ____________________________________________  PROCEDURES  Prednisone 60 mg PO Zofran 4 mg ODT  Procedures ____________________________________________  INITIAL IMPRESSION / ASSESSMENT AND PLAN / ED COURSE  DDX: viral pneumonia, CAP, COVID, bronchitis  Patient with ED evaluation of symptoms related to her diagnosis of Covid from less than a week earlier.  Patient presents with some mild shortness of breath is worsened with talking.  She presented with a request for a chest x-ray given her symptoms.  She is apparently awaiting approval for monoclonal antibody infusion.  Patient's exam is overall benign reassuring at this time.  Her x-ray does show some early pneumonitis/Covid pneumonia opacities of the bilateral bases.  Patient is without any signs of acute respiratory distress or dehydration.  She will be discharged with a prescription for prednisone and Zofran to take as directed.  She is also advised to follow-up with [facility] infusion clinic for MAB therapy.  Janit Cutter was evaluated in Emergency Department on 09/02/2020 for the symptoms described in the history of present illness. She was evaluated in the context of the global COVID-19 pandemic, which necessitated consideration that the patient might be at risk for infection with the SARS-CoV-2 virus that causes COVID-19. Institutional  protocols and algorithms that pertain to the evaluation of patients at risk for COVID-19 are in a state of rapid change based on information released by regulatory bodies including the CDC and federal and state organizations. These policies and algorithms were followed during the patient's care in the ED. ____________________________________________  FINAL CLINICAL IMPRESSION(S) / ED DIAGNOSES  Final diagnoses:  [id number]      Melvenia Needles, PA-C 09/02/20 1847    Blake Divine, MD 09/02/20 2357

## 2020-09-02 NOTE — Discharge Instructions (Addendum)
[facility]   [place], [place] [address]   [phone]    Take the prescription meds as directed.  Follow-up with the [facility] infusion center as discussed.

## 2020-09-02 NOTE — ED Triage Notes (Signed)
Diagnosed with COVID last Tuesday. Arrives today for CXR because she feels pain with inspiration.  AAOx3.  Skin warm and dry. No SOB/ DOE.  NAD

## 2021-04-16 ENCOUNTER — Other Ambulatory Visit: Payer: Self-pay | Admitting: Family Medicine

## 2021-04-16 DIAGNOSIS — Z1231 Encounter for screening mammogram for malignant neoplasm of breast: Secondary | ICD-10-CM

## 2021-04-21 ENCOUNTER — Telehealth (INDEPENDENT_AMBULATORY_CARE_PROVIDER_SITE_OTHER): Payer: Self-pay | Admitting: Gastroenterology

## 2021-04-21 ENCOUNTER — Other Ambulatory Visit: Payer: Self-pay

## 2021-04-21 DIAGNOSIS — Z1211 Encounter for screening for malignant neoplasm of colon: Secondary | ICD-10-CM

## 2021-04-21 MED ORDER — NA SULFATE-K SULFATE-MG SULF 17.5-3.13-1.6 GM/177ML PO SOLN: 1.0000 | Freq: Once | ORAL | 0 refills | Status: AC

## 2021-04-21 NOTE — Progress Notes (Signed)
Gastroenterology Pre-Procedure Review  Request Date: 05/16 Requesting Physician: Dr. Allen Norris  PATIENT REVIEW QUESTIONS: The patient responded to the following health history questions as indicated:    1. Are you having any GI issues? no 2. Do you have a personal history of Polyps? yes (unsure of when it was years ago) 3. Do you have a family history of Colon Cancer or Polyps? no 4. Diabetes Mellitus? yes (controlled oral meds) 5. Joint replacements in the past 12 months?no 6. Major health problems in the past 3 months?no 7. Any artificial heart valves, MVP, or defibrillator?no    MEDICATIONS & ALLERGIES:    Patient reports the following regarding taking any anticoagulation/antiplatelet therapy:   Plavix, Coumadin, Eliquis, Xarelto, Lovenox, Pradaxa, Brilinta, or Effient? no Aspirin? no  Patient confirms/reports the following medications:  Current Outpatient Medications  Medication Sig Dispense Refill  . albuterol (VENTOLIN HFA) 108 (90 Base) MCG/ACT inhaler Inhale 2 puffs into the lungs every 6 (six) hours as needed for shortness of breath. 6.7 g 0  . ALPRAZolam (XANAX) 0.25 MG tablet One every eight hours as needed for anxiety    . atorvastatin (LIPITOR) 40 MG tablet TAKE 1 TABLET BY MOUTH EVERY DAY    . citalopram (CELEXA) 20 MG tablet Take 30 mg by mouth daily.    . clotrimazole-betamethasone (LOTRISONE) cream Apply 1 application topically 2 (two) times daily. Apply externally BID sx for 2 wks 45 g 0  . cyclobenzaprine (FLEXERIL) 5 MG tablet TAKE 1 TABLET BY MOUTH 3 (THREE) TIMES DAILY AS NEEDED FOR MUSCLE SPASMS FOR UP TO 10 DAYS    . enalapril-hydrochlorothiazide (VASERETIC) 10-25 MG tablet Take by mouth.    Marland Kitchen ibuprofen (ADVIL,MOTRIN) 600 MG tablet Take 1 tablet (600 mg total) by mouth every 8 (eight) hours as needed. 30 tablet 0  . metFORMIN (GLUCOPHAGE) 500 MG tablet Take by mouth.    . mometasone (NASONEX) 50 MCG/ACT nasal spray Place 2 sprays into the nose daily. 17 g 6  . ONE  TOUCH ULTRA TEST test strip Apply 1 strip topically 3 (three) times daily as needed.  11   No current facility-administered medications for this visit.    Patient confirms/reports the following allergies:  Allergies  Allergen Reactions  . Prednisone Nausea And Vomiting    No orders of the defined types were placed in this encounter.   AUTHORIZATION INFORMATION Primary Insurance: 1D#: Group #:  Secondary Insurance: 1D#: Group #:  SCHEDULE INFORMATION: Date: 05/16/20Time: Location:[facility]

## 2021-05-02 ENCOUNTER — Other Ambulatory Visit: Payer: Self-pay

## 2021-05-02 ENCOUNTER — Telehealth: Payer: Self-pay

## 2021-05-02 MED ORDER — PEG 3350-KCL-NA BICARB-NACL 420 G PO SOLR: ORAL | 0 refills | Status: DC

## 2021-05-02 NOTE — Telephone Encounter (Signed)
Patient has a upcoming procedure and can't afford the $120 prep  that was sent to her pharmacy. Would like to have a cheaper prep re-sent to the pharmacy

## 2021-05-02 NOTE — Telephone Encounter (Signed)
Sent cheaper bowel prep to pt's pharmacy. Pt notified.

## 2021-05-05 ENCOUNTER — Encounter: Payer: Self-pay | Admitting: Gastroenterology

## 2021-05-06 ENCOUNTER — Other Ambulatory Visit: Payer: Self-pay

## 2021-05-08 ENCOUNTER — Other Ambulatory Visit: Payer: Self-pay

## 2021-05-08 ENCOUNTER — Other Ambulatory Visit
Admission: RE | Admit: 2021-05-08 | Discharge: 2021-05-08 | Disposition: A | Discharge status: Home / Self Care | Payer: Medicare Other | Source: Ambulatory Visit | Attending: Gastroenterology | Admitting: Gastroenterology

## 2021-05-08 DIAGNOSIS — Z20822 Contact with and (suspected) exposure to covid-19: Secondary | ICD-10-CM | POA: Diagnosis not present

## 2021-05-08 DIAGNOSIS — Z01812 Encounter for preprocedural laboratory examination: Secondary | ICD-10-CM | POA: Insufficient documentation

## 2021-05-08 LAB — SARS CORONAVIRUS 2 (TAT 6-24 HRS): SARS Coronavirus 2: NEGATIVE

## 2021-05-09 NOTE — Discharge Instructions (Signed)

## 2021-05-12 ENCOUNTER — Encounter: Payer: Self-pay | Admitting: Gastroenterology

## 2021-05-12 ENCOUNTER — Ambulatory Visit: Payer: Medicare Other | Admitting: Anesthesiology

## 2021-05-12 ENCOUNTER — Ambulatory Visit
Admission: RE | Admit: 2021-05-12 | Discharge: 2021-05-12 | Disposition: A | Discharge status: Home / Self Care | Payer: Medicare Other | Attending: Gastroenterology | Admitting: Gastroenterology

## 2021-05-12 ENCOUNTER — Encounter
Admission: RE | Disposition: A | Discharge status: Home / Self Care | Payer: Self-pay | Source: Home / Self Care | Attending: Gastroenterology

## 2021-05-12 ENCOUNTER — Other Ambulatory Visit: Payer: Self-pay

## 2021-05-12 DIAGNOSIS — Z79899 Other long term (current) drug therapy: Secondary | ICD-10-CM | POA: Diagnosis not present

## 2021-05-12 DIAGNOSIS — Z888 Allergy status to other drugs, medicaments and biological substances status: Secondary | ICD-10-CM | POA: Diagnosis not present

## 2021-05-12 DIAGNOSIS — K640 First degree hemorrhoids: Secondary | ICD-10-CM | POA: Insufficient documentation

## 2021-05-12 DIAGNOSIS — D124 Benign neoplasm of descending colon: Secondary | ICD-10-CM | POA: Insufficient documentation

## 2021-05-12 DIAGNOSIS — Z87891 Personal history of nicotine dependence: Secondary | ICD-10-CM | POA: Diagnosis not present

## 2021-05-12 DIAGNOSIS — K635 Polyp of colon: Secondary | ICD-10-CM

## 2021-05-12 DIAGNOSIS — Z7984 Long term (current) use of oral hypoglycemic drugs: Secondary | ICD-10-CM | POA: Insufficient documentation

## 2021-05-12 DIAGNOSIS — Z1211 Encounter for screening for malignant neoplasm of colon: Secondary | ICD-10-CM | POA: Diagnosis present

## 2021-05-12 HISTORY — DX: Sleep apnea, unspecified: G47.30

## 2021-05-12 HISTORY — DX: Unspecified osteoarthritis, unspecified site: M19.90

## 2021-05-12 HISTORY — DX: Presence of dental prosthetic device (complete) (partial): Z97.2

## 2021-05-12 HISTORY — PX: POLYPECTOMY: SHX5525

## 2021-05-12 HISTORY — PX: COLONOSCOPY WITH PROPOFOL: SHX5780

## 2021-05-12 SURGERY — COLONOSCOPY WITH PROPOFOL
Anesthesia: General | Site: Rectum

## 2021-05-12 MED ORDER — STERILE WATER FOR IRRIGATION IR SOLN: Status: DC | PRN

## 2021-05-12 MED ORDER — ACETAMINOPHEN 325 MG PO TABS: 325.0000 mg | ORAL_TABLET | ORAL | Status: DC | PRN

## 2021-05-12 MED ORDER — ACETAMINOPHEN 160 MG/5ML PO SOLN: 325.0000 mg | ORAL | Status: DC | PRN

## 2021-05-12 MED ORDER — ONDANSETRON HCL 4 MG/2ML IJ SOLN: 4.0000 mg | Freq: Once | INTRAMUSCULAR | Status: DC | PRN

## 2021-05-12 MED ORDER — LIDOCAINE HCL (CARDIAC) PF 100 MG/5ML IV SOSY
PREFILLED_SYRINGE | INTRAVENOUS | Status: DC | PRN
  Administered 2021-05-12: 30 mg via INTRAVENOUS

## 2021-05-12 MED ORDER — LACTATED RINGERS IV SOLN: INTRAVENOUS | Status: DC

## 2021-05-12 MED ORDER — PROPOFOL 10 MG/ML IV BOLUS
INTRAVENOUS | Status: DC | PRN
  Administered 2021-05-12: 30 mg via INTRAVENOUS
  Administered 2021-05-12: 40 mg via INTRAVENOUS
  Administered 2021-05-12: 30 mg via INTRAVENOUS
  Administered 2021-05-12: 150 mg via INTRAVENOUS
  Administered 2021-05-12: 50 mg via INTRAVENOUS
  Administered 2021-05-12: 30 mg via INTRAVENOUS

## 2021-05-12 SURGICAL SUPPLY — 8 items
GOWN CVR UNV OPN BCK APRN NK (MISCELLANEOUS) ×2 IMPLANT
GOWN ISOL THUMB LOOP REG UNIV (MISCELLANEOUS) ×4
KIT PRC NS LF DISP ENDO (KITS) ×1 IMPLANT
KIT PROCEDURE OLYMPUS (KITS) ×2
MANIFOLD NEPTUNE II (INSTRUMENTS) ×2 IMPLANT
SNARE COLD EXACTO (MISCELLANEOUS) ×2 IMPLANT
TRAP ETRAP POLY (MISCELLANEOUS) ×2 IMPLANT
WATER STERILE IRR 250ML POUR (IV SOLUTION) ×2 IMPLANT

## 2021-05-12 NOTE — Anesthesia Preprocedure Evaluation (Addendum)
Anesthesia Evaluation  Patient identified by MRN, date of birth, ID band Patient awake    Reviewed: Allergy & Precautions, NPO status   Airway Mallampati: II  TM Distance: >3 FB     Dental   Pulmonary sleep apnea (no cpap) , former smoker,    Pulmonary exam normal        Cardiovascular hypertension,  Rhythm:Regular Rate:Normal  HLD   Neuro/Psych PSYCHIATRIC DISORDERS Anxiety Depression    GI/Hepatic   Endo/Other  diabetes, Type 2  Renal/GU      Musculoskeletal  (+) Arthritis ,   Abdominal   Peds  Hematology   Anesthesia Other Findings   Reproductive/Obstetrics                             Anesthesia Physical Anesthesia Plan  ASA: III  Anesthesia Plan: General   Post-op Pain Management:    Induction: Intravenous  PONV Risk Score and Plan: TIVA, Midazolam and Treatment may vary due to age or medical condition  Airway Management Planned: Natural Airway and Nasal Cannula  Additional Equipment:   Intra-op Plan:   Post-operative Plan:   Informed Consent: I have reviewed the patients History and Physical, chart, labs and discussed the procedure including the risks, benefits and alternatives for the proposed anesthesia with the patient or authorized representative who has indicated his/her understanding and acceptance.       Plan Discussed with: CRNA  Anesthesia Plan Comments:        Anesthesia Quick Evaluation

## 2021-05-12 NOTE — Anesthesia Postprocedure Evaluation (Signed)
Anesthesia Post Note  Patient: Katherine Francis  Procedure(s) Performed: COLONOSCOPY WITH BIOPSY (N/A Rectum) POLYPECTOMY (N/A Rectum)     Patient location during evaluation: PACU Anesthesia Type: General Level of consciousness: awake Pain management: pain level controlled Vital Signs Assessment: post-procedure vital signs reviewed and stable Respiratory status: respiratory function stable Cardiovascular status: stable Postop Assessment: no signs of nausea or vomiting Anesthetic complications: no   No complications documented.  Veda Canning

## 2021-05-12 NOTE — H&P (Signed)
Katherine Lame, MD [facility] [address] [place], [place] [address] Phone: [phone] Fax : [phone]  Primary Care Physician:  Ranae Plumber, [place] Primary Gastroenterologist:  Dr. Allen Norris  Pre-Procedure History & Physical: HPI:  Katherine Francis is a 65 y.o. female is here for a screening colonoscopy.   Past Medical History:  Diagnosis Date  . Anxiety   . Arthritis    fingers and back  . Diabetes mellitus without complication ([place])   . Hyperlipidemia   . Hypertension   . Sleep apnea    no CPAP  . Wears partial dentures    upper    Past Surgical History:  Procedure Laterality Date  . CESAREAN SECTION    . COLPOSCOPY  2014  . HYSTERECTOMY ABDOMINAL WITH SALPINGECTOMY      Prior to Admission medications   Medication Sig Start Date End Date Taking? Authorizing Provider  atorvastatin (LIPITOR) 40 MG tablet TAKE 1 TABLET BY MOUTH EVERY DAY 03/19/18  Yes [provider]  buPROPion (WELLBUTRIN XL) 150 MG 24 hr tablet 1 by mouth daily for mood. 04/16/21  Yes [provider]  Cholecalciferol (D3-1000 PO) Take by mouth daily.   Yes [provider]  fluticasone (FLONASE) 50 MCG/ACT nasal spray Place into the nose. 05/31/20 05/31/21 Yes [provider]  polyethylene glycol-electrolytes (GAVILYTE-N WITH FLAVOR PACK) 420 g solution **Drink one 8 oz glass every 20 mins until entire container is finished starting at 5:00pm on 05/11/21 05/02/21  Yes Curby Carswell, MD  Zinc Sulfate (ZINC 15 PO) Take by mouth daily.   Yes [provider]  albuterol (VENTOLIN HFA) 108 (90 Base) MCG/ACT inhaler Inhale 2 puffs into the lungs every 6 (six) hours as needed for shortness of breath. Patient not taking: Reported on 05/12/2021 09/02/20   Menshew, Dannielle Karvonen, PA-C  Blood Glucose Monitoring Suppl (GLUCOCOM BLOOD GLUCOSE MONITOR) DEVI Please provide patient with glucometer covered by her insurance, preferably [employer]. 08/12/20   [provider]   citalopram (CELEXA) 20 MG tablet Take 30 mg by mouth daily. 12/25/15 12/24/16  [provider]  enalapril-hydrochlorothiazide (VASERETIC) 10-25 MG tablet Take by mouth daily. 10/04/17   [provider]  ibuprofen (ADVIL,MOTRIN) 600 MG tablet Take 1 tablet (600 mg total) by mouth every 8 (eight) hours as needed. 11/13/16   Betancourt, Aura Fey, NP  metFORMIN (GLUCOPHAGE) 500 MG tablet Take by mouth. 08/09/19   [provider]  ONE TOUCH ULTRA TEST test strip Apply 1 strip topically 3 (three) times daily as needed. 10/08/16   [provider]    Allergies as of 04/21/2021 - Review Complete 04/21/2021  Allergen Reaction Noted  . Prednisone Nausea And Vomiting 03/21/2018    Family History  Problem Relation Age of Onset  . Uterine cancer Mother   . Colon cancer Brother   . Lung cancer Brother   . Skin cancer Brother     Social History   Socioeconomic History  . Marital status: Married    Spouse name: Not on file  . Number of children: Not on file  . Years of education: Not on file  . Highest education level: Not on file  Occupational History  . Not on file  Tobacco Use  . Smoking status: Former Research scientist (life sciences)  . Smokeless tobacco: Never Used  Vaping Use  . Vaping Use: Never used  Substance and Sexual Activity  . Alcohol use: Not Currently  . Drug use: Never  . Sexual activity: Not Currently  Birth control/protection: None  Other Topics Concern  . Not on file  Social History Narrative  . Not on file   Social Determinants of Health   Financial Resource Strain: Not on file  Food Insecurity: Not on file  Transportation Needs: Not on file  Physical Activity: Not on file  Stress: Not on file  Social Connections: Not on file  Intimate Partner Violence: Not on file    Review of Systems: See HPI, otherwise negative ROS  Physical Exam: BP 139/72   Pulse 71   Temp (!) 97.3 F (36.3 C) (Temporal)   Resp 18   Ht 5\' 2"  (1.575 m)   Wt 66.7 kg    SpO2 97%   BMI 26.89 kg/m  General:   Alert,  pleasant and cooperative in NAD Head:  Normocephalic and atraumatic. Neck:  Supple; no masses or thyromegaly. Lungs:  Clear throughout to auscultation.    Heart:  Regular rate and rhythm. Abdomen:  Soft, nontender and nondistended. Normal bowel sounds, without guarding, and without rebound.   Neurologic:  Alert and  oriented x4;  grossly normal neurologically.  Impression/Plan: Katherine Francis is now here to undergo a screening colonoscopy.  Risks, benefits, and alternatives regarding colonoscopy have been reviewed with the patient.  Questions have been answered.  All parties agreeable.

## 2021-05-12 NOTE — Op Note (Signed)
[facility] Patient Name: Katherine Francis Procedure Date: 05/12/2021 10:20 AM MRN: [id number] Account #: [id number] Date of Birth: [birth year] Admit Type: Outpatient Age: 65 Room: [facility] OR ROOM 01 Gender: Female Note Status: Finalized Procedure:             Colonoscopy Indications:           Screening for colorectal malignant neoplasm Providers:             [name] MD, MD Referring MD:          Ranae Plumber PA Medicines:             Propofol per Anesthesia Complications:         No immediate complications. Procedure:             Pre-Anesthesia Assessment:                        - Prior to the procedure, a History and Physical was                         performed, and patient medications and allergies were                         reviewed. The patient's tolerance of previous                         anesthesia was also reviewed. The risks and benefits                         of the procedure and the sedation options and risks                         were discussed with the patient. All questions were                         answered, and informed consent was obtained. Prior                         Anticoagulants: The patient has taken no previous                         anticoagulant or antiplatelet agents. ASA Grade                         Assessment: II - A patient with mild systemic disease.                         After reviewing the risks and benefits, the patient                         was deemed in satisfactory condition to undergo the                         procedure.                        After obtaining informed consent, the colonoscope was  passed under direct vision. Throughout the procedure,                         the patient's blood pressure, pulse, and oxygen                         saturations were monitored continuously. The                         Colonoscope was introduced through the anus and                          advanced to the the cecum, identified by appendiceal                         orifice and ileocecal valve. The colonoscopy was                         performed without difficulty. The patient tolerated                         the procedure well. The quality of the bowel                         preparation was excellent. Findings:      The perianal and digital rectal examinations were normal.      A 5 mm polyp was found in the sigmoid colon. The polyp was sessile. The       polyp was removed with a cold snare. Resection and retrieval were       complete.      A 4 mm polyp was found in the descending colon. The polyp was sessile.       The polyp was removed with a cold snare. Resection and retrieval were       complete.      Non-bleeding internal hemorrhoids were found during retroflexion. The       hemorrhoids were Grade I (internal hemorrhoids that do not prolapse). Impression:            - One 5 mm polyp in the sigmoid colon, removed with a                         cold snare. Resected and retrieved.                        - One 4 mm polyp in the descending colon, removed with                         a cold snare. Resected and retrieved.                        - Non-bleeding internal hemorrhoids. Recommendation:        - Discharge patient to home.                        - Resume previous diet.                        - Continue present medications.                        -  Await pathology results.                        - Repeat colonoscopy in 7 years for surveillance if                         adenomatous and 10 years if hyperplastic. Procedure Code(s):     --- Professional ---                        [phone], Colonoscopy, flexible; with removal of                         tumor(s), polyp(s), or other lesion(s) by snare                         technique Diagnosis Code(s):     --- Professional ---                        Z12.11, Encounter for screening for malignant neoplasm                          of colon                        K63.5, Polyp of colon CPT copyright 2019 American Medical Association. All rights reserved. The codes documented in this report are preliminary and upon coder review may  be revised to meet current compliance requirements. [name] MD, MD 05/12/2021 10:44:50 AM This report has been signed electronically. Number of Addenda: 0 Note Initiated On: 05/12/2021 10:20 AM Scope Withdrawal Time: 0 hours 9 minutes 32 seconds  Total Procedure Duration: 0 hours 15 minutes 31 seconds  Estimated Blood Loss:  Estimated blood loss: none.      [facility]

## 2021-05-12 NOTE — Transfer of Care (Signed)
Immediate Anesthesia Transfer of Care Note  Patient: Katherine Francis  Procedure(s) Performed: COLONOSCOPY WITH BIOPSY (N/A Rectum) POLYPECTOMY (N/A Rectum)  Patient Location: PACU  Anesthesia Type: General  Level of Consciousness: awake, alert  and patient cooperative  Airway and Oxygen Therapy: Patient Spontanous Breathing and Patient connected to supplemental oxygen  Post-op Assessment: Post-op Vital signs reviewed, Patient's Cardiovascular Status Stable, Respiratory Function Stable, Patent Airway and No signs of Nausea or vomiting  Post-op Vital Signs: Reviewed and stable  Complications: No complications documented.

## 2021-05-12 NOTE — Anesthesia Procedure Notes (Signed)
Date/Time: 05/12/2021 10:24 AM Performed by: Cameron Ali, CRNA Pre-anesthesia Checklist: Patient identified, Emergency Drugs available, Suction available, Timeout performed and Patient being monitored Patient Re-evaluated:Patient Re-evaluated prior to induction Oxygen Delivery Method: Nasal cannula Placement Confirmation: positive ETCO2

## 2021-05-13 ENCOUNTER — Encounter: Payer: Self-pay | Admitting: Gastroenterology

## 2021-05-13 LAB — SURGICAL PATHOLOGY

## 2021-05-13 LAB — GLUCOSE, CAPILLARY: Glucose-Capillary: 107 mg/dL — ABNORMAL HIGH (ref 70–99)

## 2021-05-15 ENCOUNTER — Encounter: Payer: Self-pay | Admitting: Gastroenterology

## 2022-05-02 IMAGING — CR DG CHEST 2V
2 series · 2 of 2 positions shown · non-contrast
Comparison: 10/30/2012

CLINICAL DATA: Shortness of breath. [id number] diagnosed 6 days ago.
Ex-smoker.

EXAM:
CHEST - 2 VIEW

[chest pa]
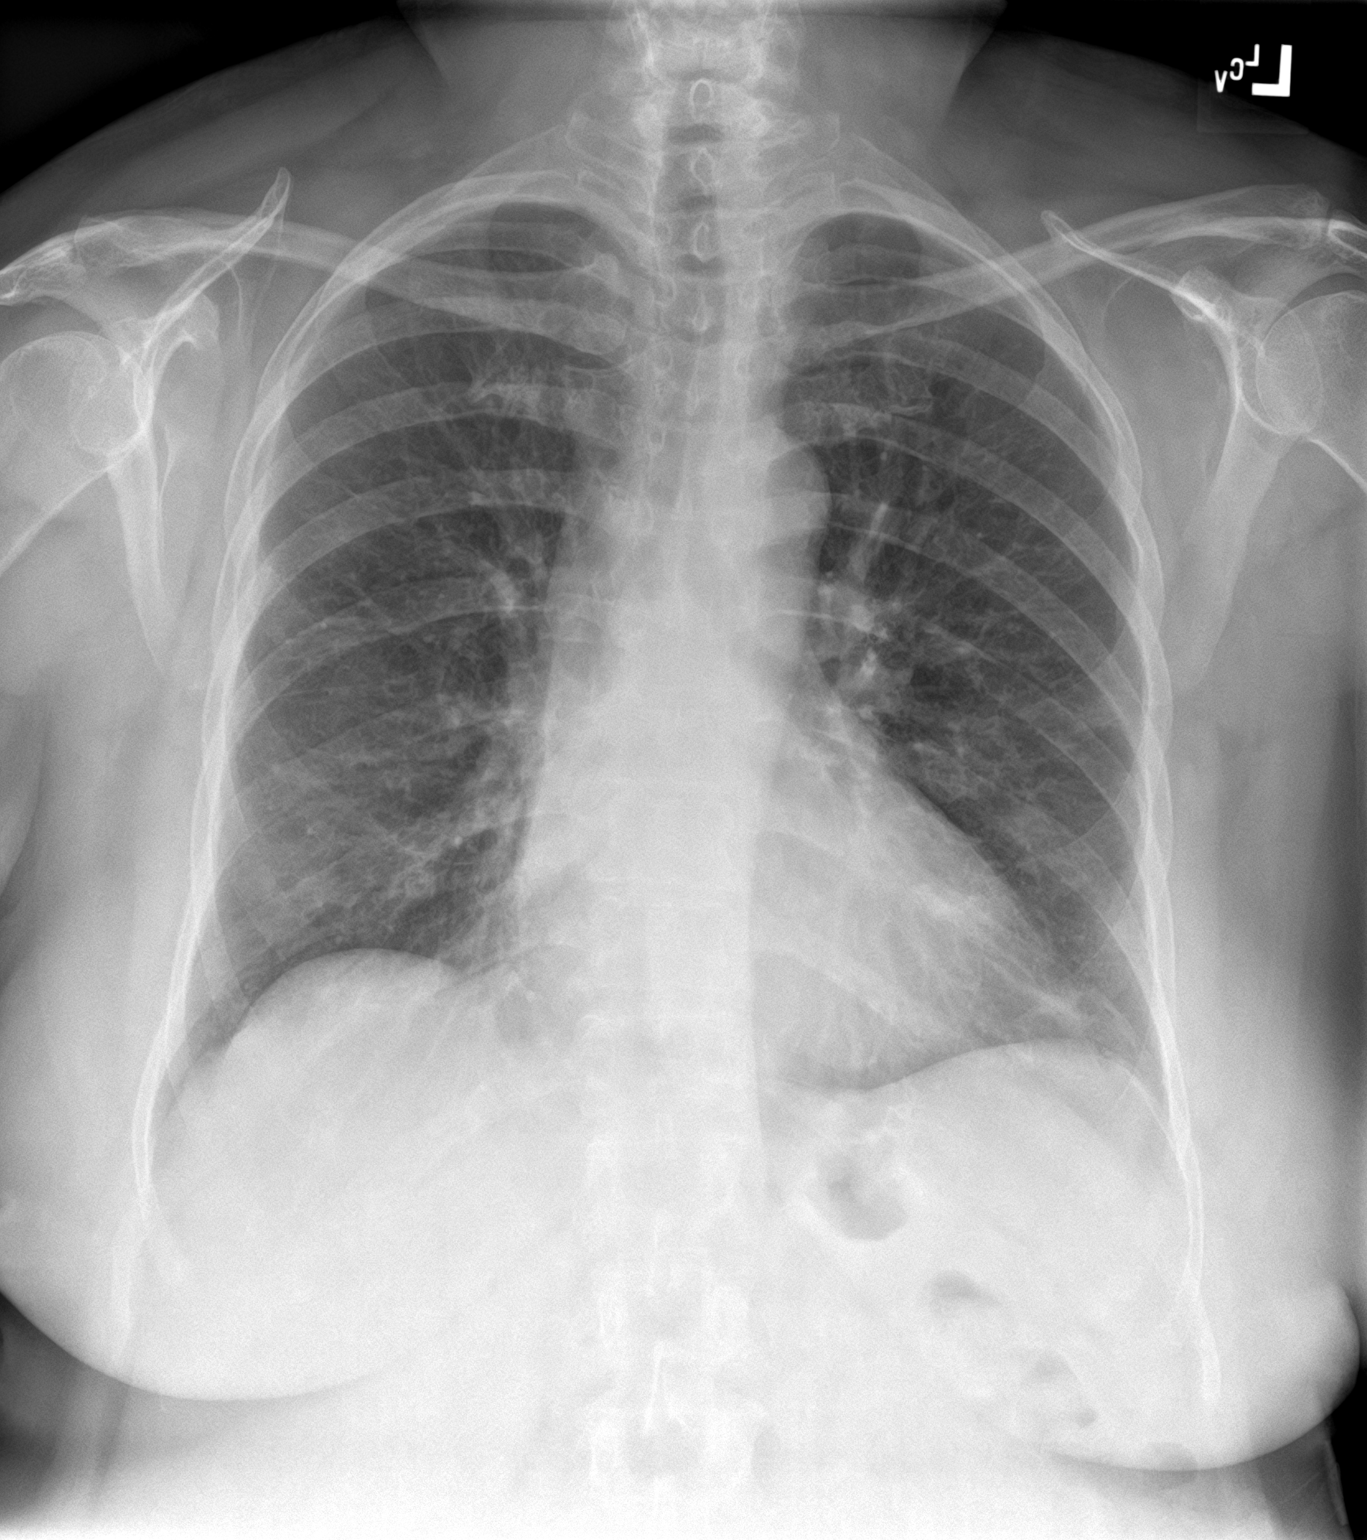

[chest lat]
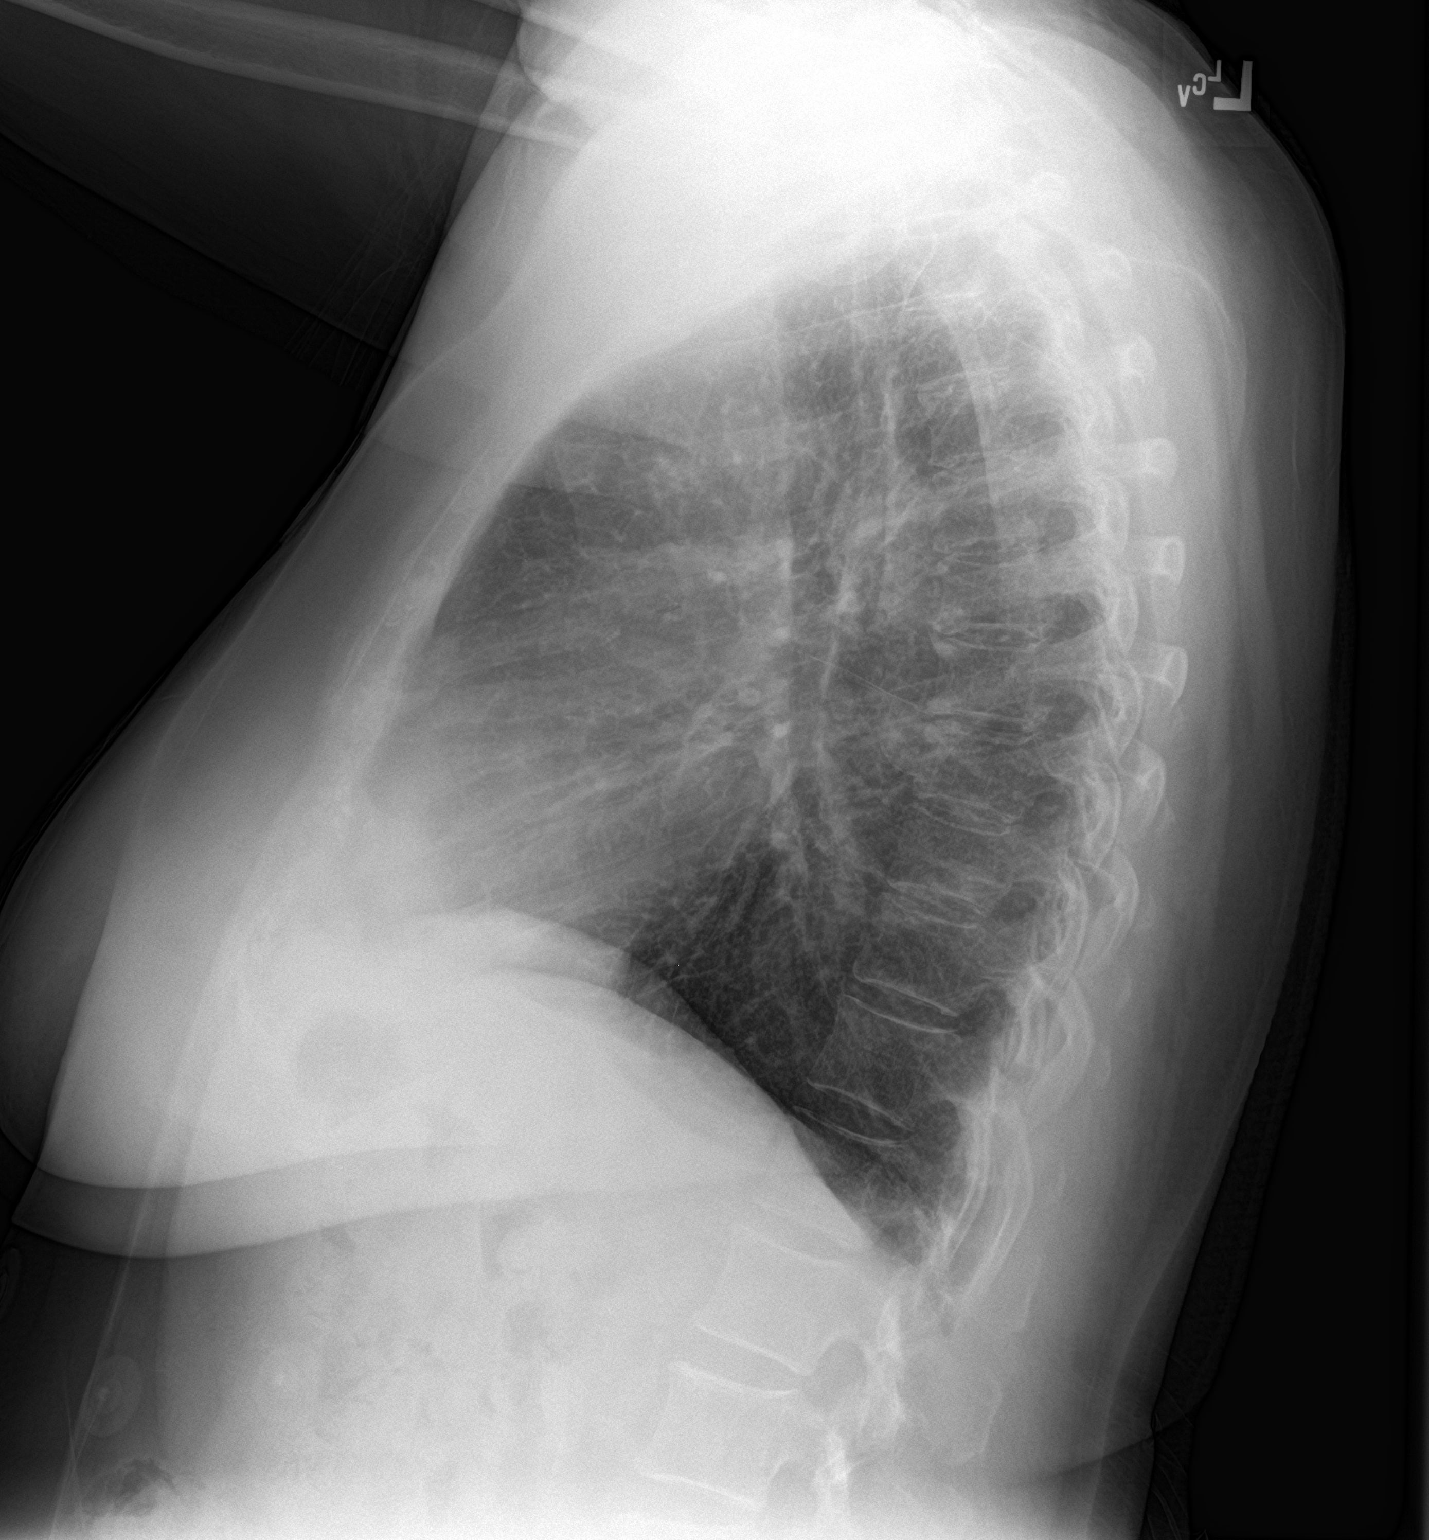

[2 of 2 positions shown; findings below may reference images not displayed]

FINDINGS: Normal sized heart. Minimal patchy densities in both lower lung
zones. Minimal peribronchial thickening. Mild scoliosis.
IMPRESSION: 1. Minimal patchy densities in both lower lung zones, suspicious for
early changes of COVID pneumonia/pneumonitis.
2. Minimal bronchitic changes.

## 2023-01-29 ENCOUNTER — Ambulatory Visit
Admission: EM | Admit: 2023-01-29 | Discharge: 2023-01-29 | Disposition: A | Discharge status: Home / Self Care | Payer: Medicare Other | Attending: Physician Assistant | Admitting: Physician Assistant

## 2023-01-29 ENCOUNTER — Ambulatory Visit (INDEPENDENT_AMBULATORY_CARE_PROVIDER_SITE_OTHER): Payer: Medicare Other

## 2023-01-29 DIAGNOSIS — R051 Acute cough: Secondary | ICD-10-CM | POA: Diagnosis not present

## 2023-01-29 DIAGNOSIS — J329 Chronic sinusitis, unspecified: Secondary | ICD-10-CM

## 2023-01-29 DIAGNOSIS — R062 Wheezing: Secondary | ICD-10-CM

## 2023-01-29 DIAGNOSIS — R0981 Nasal congestion: Secondary | ICD-10-CM

## 2023-01-29 MED ORDER — PSEUDOEPH-BROMPHEN-DM 30-2-10 MG/5ML PO SYRP: 10.0000 mL | ORAL_SOLUTION | Freq: Four times a day (QID) | ORAL | 0 refills | Status: AC | PRN

## 2023-01-29 MED ORDER — IPRATROPIUM BROMIDE 0.06 % NA SOLN: 2.0000 | Freq: Four times a day (QID) | NASAL | 0 refills | Status: DC

## 2023-01-29 MED ORDER — AMOXICILLIN-POT CLAVULANATE 875-125 MG PO TABS: 1.0000 | ORAL_TABLET | Freq: Two times a day (BID) | ORAL | 0 refills | Status: AC

## 2023-01-29 NOTE — Discharge Instructions (Signed)
-  The chest x-ray does not show any evidence of pneumonia. - You have chronic sinusitis.  I have sent an antibiotic to the pharmacy as well as a nasal spray and cough medication/decongestant.   -I also placed a referral to ENT specialist for you. - Increase your rest and fluids.  You can also continue doing nasal saline rinses. - If you develop a fever or have worsening of your breathing before he can see ENT, go to emergency department.  If you are unable to follow-up with ENT, make another appointment with your PCP.

## 2023-01-29 NOTE — ED Provider Notes (Signed)
[facility]    CSN: [id number] Arrival date & time: 01/29/23  1331      History   Chief Complaint Chief Complaint  Patient presents with   Cough   Nasal Congestion    HPI Katherine Francis is a 67 y.o. female presenting for approximately 54-monthhistory of nasal congestion and cough productive cough.  Patient says she was seen initially by her PCP back in November and thought to have bronchitis at the onset of her symptoms.  She was given azithromycin.  She was then seen at an urgent care on 12/14/2022.  Had an x-ray performed at that time and was thought to have postviral cough and wheezing.  She was given albuterol and prednisone after having a normal x-ray.  She says so far none of these medications helped.  She also reports trying Mucinex over-the-counter and allergy medication.  Patient says she has never had symptoms last this long before when she has been sick.  She has no history of COPD, emphysema or chronic sinusitis.  She reports occasionally using Flonase and nasal saline.  She reports over the past 3 weeks she started to have greenish sputum production and nasal drainage.  She also reports left-sided ear pain and fullness as well as wheezing and occasional shortness of breath when climbing stairs.  Medical history significant for diabetes, hypertension, hyperlipidemia, sleep apnea.  Also has history of allergies.  No other concerns.  HPI  Past Medical History:  Diagnosis Date   Anxiety    Arthritis    fingers and back   Diabetes mellitus [place] complication ([place]    Hyperlipidemia    Hypertension    Sleep apnea    no CPAP   Wears partial dentures    upper    Patient Active Problem List   Diagnosis Date Noted   Special screening for malignant neoplasms, colon    Polyp of descending colon    Allergic conjunctivitis and rhinitis, bilateral 05/31/2020   Major depressive disorder, recurrent, mild ([place] 05/31/2020   OSA (obstructive sleep apnea) 08/28/2015    Ganglion of knee, right 01/11/2015   Sprain of wrist, right 05/16/2013   Tendonitis, calcific, shoulder 03/09/2012   Shoulder pain 03/08/2012   DM type 2 (diabetes mellitus, type 2) ([place] 02/19/2012   Allergic rhinitis 08/13/2011   Anxiety 08/13/2011   Depression 08/13/2011   HTN (hypertension) 08/13/2011   Hyperlipidemia, unspecified 08/13/2011   Obesity 08/13/2011   S/P TAH-BSO 08/13/2011   Tinnitus 08/13/2011   Hyperlipidemia 08/13/2011    Past Surgical History:  Procedure Laterality Date   CESAREAN SECTION     COLONOSCOPY WITH PROPOFOL N/A 05/12/2021   Procedure: COLONOSCOPY WITH BIOPSY;  Surgeon: WLucilla Lame MD;  [facility]  Service: Endoscopy;  Laterality: N/A;  Needs COVID testing   COLPOSCOPY  2014   HYSTERECTOMY ABDOMINAL WITH SALPINGECTOMY     POLYPECTOMY N/A 05/12/2021   Procedure: POLYPECTOMY;  Surgeon: WLucilla Lame MD;  [facility]  Service: Endoscopy;  Laterality: N/A;    OB History     Gravida  6   Para  4   Term  4   Preterm      AB  2   Living  4      SAB  2   IAB      Ectopic      Multiple      Live Births  4            Home  Medications    Prior to Admission medications   Medication Sig Start Date End Date Taking? Authorizing Provider  amoxicillin-clavulanate (AUGMENTIN) 875-125 MG tablet Take 1 tablet by mouth every 12 (twelve) hours for 10 days. 01/29/23 02/08/23 Yes Laurene Footman B, PA-C  atorvastatin (LIPITOR) 40 MG tablet TAKE 1 TABLET BY MOUTH EVERY DAY 03/19/18  Yes [provider]  brompheniramine-pseudoephedrine-DM 30-2-10 MG/5ML syrup Take 10 mLs by mouth 4 (four) times daily as needed for up to 7 days. 01/29/23 02/05/23 Yes Danton Clap, PA-C  buPROPion (WELLBUTRIN XL) 150 MG 24 hr tablet 1 by mouth daily for mood. 04/16/21  Yes [provider]  Cholecalciferol (D3-1000 PO) Take by mouth daily.   Yes [provider]  citalopram (CELEXA) 20 MG tablet Take 30  mg by mouth daily. 12/25/15 01/29/23 Yes [provider]  enalapril-hydrochlorothiazide (VASERETIC) 10-25 MG tablet Take by mouth daily. 10/04/17  Yes [provider]  ibuprofen (ADVIL,MOTRIN) 600 MG tablet Take 1 tablet (600 mg total) by mouth every 8 (eight) hours as needed. 11/13/16  Yes Betancourt, Tina A, NP  ipratropium (ATROVENT) 0.06 % nasal spray Place 2 sprays into both nostrils 4 (four) times daily. 01/29/23  Yes Laurene Footman B, PA-C  metFORMIN (GLUCOPHAGE) 500 MG tablet Take by mouth. 08/09/19  Yes [provider]  Zinc Sulfate (ZINC 15 PO) Take by mouth daily.   Yes [provider]  albuterol (VENTOLIN HFA) 108 (90 Base) MCG/ACT inhaler Inhale 2 puffs into the lungs every 6 (six) hours as needed for shortness of breath. Patient not taking: Reported on 05/12/2021 09/02/20   Menshew, Dannielle Karvonen, PA-C  Blood Glucose Monitoring Suppl (GLUCOCOM BLOOD GLUCOSE MONITOR) DEVI Please provide patient with glucometer covered by her insurance, preferably [employer]. 08/12/20   [provider]  fluticasone (FLONASE) 50 MCG/ACT nasal spray Place into the nose. 05/31/20 05/31/21  [provider]  ONE TOUCH ULTRA TEST test strip Apply 1 strip topically 3 (three) times daily as needed. 10/08/16   [provider]  polyethylene glycol-electrolytes (GAVILYTE-N WITH FLAVOR PACK) 420 g solution **Drink one 8 oz glass every 20 mins until entire container is finished starting at 5:00pm on 05/11/21 05/02/21   [name], MD    Family History Family History  Problem Relation Age of Onset   Uterine cancer Mother    Colon cancer Brother    Lung cancer Brother    Skin cancer Brother     Social History Social History   Tobacco Use   Smoking status: Former   Smokeless tobacco: Never  Scientific laboratory technician Use: Every day  Substance Use Topics   Alcohol use: Not Currently   Drug use: Never     Allergies   Patient has no known  allergies.   Review of Systems Review of Systems  Constitutional:  Positive for fatigue. Negative for chills, diaphoresis and fever.  HENT:  Positive for congestion, ear pain, rhinorrhea, sinus pressure and sinus pain. Negative for sore throat.   Respiratory:  Positive for cough, shortness of breath and wheezing.   Gastrointestinal:  Negative for abdominal pain, nausea and vomiting.  Musculoskeletal:  Negative for arthralgias and myalgias.  Skin:  Negative for rash.  Neurological:  Positive for headaches. Negative for weakness.  Hematological:  Negative for adenopathy.     Physical Exam Triage Vital Signs ED Triage Vitals [01/29/23 1345]  Enc Vitals Group     BP      Pulse  Resp      Temp      Temp src      SpO2      Weight 151 lb (68.5 kg)     Height '5\' 1"'$  (1.549 m)     Head Circumference      Peak Flow      Pain Score 0     Pain Loc      Pain Edu?      Excl. in [place]?    No data found.  Updated Vital Signs BP (!) 153/80 (BP Location: Left Arm)   Pulse 85   Temp 98.2 F (36.8 C) (Oral)   Ht '5\' 1"'$  (1.549 m)   Wt 151 lb (68.5 kg)   SpO2 98%   BMI 28.53 kg/m       Physical Exam Vitals and nursing note reviewed.  Constitutional:      General: She is not in acute distress.    Appearance: Normal appearance. She is not ill-appearing or toxic-appearing.  HENT:     Head: Normocephalic and atraumatic.     Right Ear: Ear canal and external ear normal. A middle ear effusion is present.     Left Ear: Ear canal and external ear normal. A middle ear effusion is present.     Nose: Congestion present.     Mouth/Throat:     Mouth: Mucous membranes are moist.     Pharynx: Oropharynx is clear.  Eyes:     General: No scleral icterus.       Right eye: No discharge.        Left eye: No discharge.     Conjunctiva/sclera: Conjunctivae normal.  Cardiovascular:     Rate and Rhythm: Normal rate and regular rhythm.     Heart sounds: Normal heart sounds.  Pulmonary:      Effort: Pulmonary effort is normal. No respiratory distress.     Breath sounds: Wheezing (RML, RUL) present.  Musculoskeletal:     Cervical back: Neck supple.  Skin:    General: Skin is dry.  Neurological:     General: No focal deficit present.     Mental Status: She is alert. Mental status is at baseline.     Motor: No weakness.     Gait: Gait normal.  Psychiatric:        Mood and Affect: Mood normal.        Behavior: Behavior normal.        Thought Content: Thought content normal.      UC Treatments / Results  Labs (all labs ordered are listed, but only abnormal results are displayed) Labs Reviewed - No data to display  EKG   Radiology DG Chest 2 View  Result Date: 01/29/2023 CLINICAL DATA:  Cough and congestion EXAM: CHEST - 2 VIEW COMPARISON:  Chest x-ray dated September 02, 2020 FINDINGS: The heart size and mediastinal contours are within normal limits. Both lungs are clear. The visualized skeletal structures are unremarkable. IMPRESSION: No active cardiopulmonary disease. Electronically Signed   By: Yetta Glassman M.D.   On: 01/29/2023 14:42    Procedures Procedures (including critical care time)  Medications Ordered in UC Medications - No data to display  Initial Impression / Assessment and Plan / UC Course  I have reviewed the triage vital signs and the nursing notes.  Pertinent labs & imaging results that were available during my care of the patient were reviewed by me and considered in my medical decision making (see chart for details).  67 year old female presents for cough and congestion for the past 4 months.  Patient has been seen 2 other times for the symptoms.  Initially seen at the onset of symptoms and thought to have bronchitis.  Treated with azithromycin.  Seen about 6 weeks ago and prescribed prednisone and albuterol.  Has also taken Mucinex, antihistamines, Flonase, nasal saline all without relief.  Over the past 3 weeks has had nasal drainage become  greenish in coloration and sputum greenish in coloration.  Reports continued wheezing and shortness of breath.  No fever.  She is afebrile and overall well-appearing.  On exam she does have clear effusion of bilateral TMs, nasal congestion.  Throat is clear.  Scattered wheezes throughout the right upper lobe and right middle lobe.  No respiratory distress.  Chest x-ray obtained to assess for underlying pneumonia.  X-ray negative.  Discussed result with patient.  Chronic sinusitis.  Will treat at this time with Augmentin.  Since she says she has had yellowish-green just colored drainage over the past few weeks with recent worsening, will treat for acute on chronic sinusitis.  We also discussed possibility that her symptoms could be related to allergies.  Sent Atrovent nasal spray and Bromfed-DM to pharmacy encourage plenty rest and fluids.  Placed referral to ENT since this is a chronic issue.  Reviewed ED precautions.   Final Clinical Impressions(s) / UC Diagnoses   Final diagnoses:  Chronic sinusitis, unspecified location  Nasal congestion  Acute cough  Wheezing     Discharge Instructions      -The chest x-ray does not show any evidence of pneumonia. - You have chronic sinusitis.  I have sent an antibiotic to the pharmacy as well as a nasal spray and cough medication/decongestant.   -I also placed a referral to ENT specialist for you. - Increase your rest and fluids.  You can also continue doing nasal saline rinses. - If you develop a fever or have worsening of your breathing before he can see ENT, go to emergency department.  If you are unable to follow-up with ENT, make another appointment with your PCP.     ED Prescriptions     Medication Sig Dispense Auth. Provider   amoxicillin-clavulanate (AUGMENTIN) 875-125 MG tablet Take 1 tablet by mouth every 12 (twelve) hours for 10 days. 20 tablet Laurene Footman B, PA-C   ipratropium (ATROVENT) 0.06 % nasal spray Place 2 sprays into  both nostrils 4 (four) times daily. 15 mL Laurene Footman B, PA-C   brompheniramine-pseudoephedrine-DM 30-2-10 MG/5ML syrup Take 10 mLs by mouth 4 (four) times daily as needed for up to 7 days. 150 mL Danton Clap, PA-C      PDMP not reviewed this encounter.   Danton Clap, PA-C 01/29/23 1500

## 2023-01-29 NOTE — ED Triage Notes (Signed)
Pt is with her sister  Pt c/o constant head pressure, and congestion x76month  Pt was told by her doctor (A PA) that she had Bronchitis. Pt was put on prednisone, an albuterol inhaler, OTC mucinex and sudafed, and It has not helped.  Pt states that she is constantly coughing and blowing her nose.

## 2023-07-05 ENCOUNTER — Other Ambulatory Visit: Payer: Self-pay | Admitting: Family Medicine

## 2023-07-05 DIAGNOSIS — Z1231 Encounter for screening mammogram for malignant neoplasm of breast: Secondary | ICD-10-CM

## 2024-05-04 ENCOUNTER — Other Ambulatory Visit: Payer: Self-pay | Admitting: Family Medicine

## 2024-05-04 DIAGNOSIS — Z1231 Encounter for screening mammogram for malignant neoplasm of breast: Secondary | ICD-10-CM

## 2024-05-18 ENCOUNTER — Ambulatory Visit
Admission: RE | Admit: 2024-05-18 | Discharge: 2024-05-18 | Disposition: A | Discharge status: Home / Self Care | Source: Ambulatory Visit | Attending: Family Medicine | Admitting: Family Medicine

## 2024-05-18 DIAGNOSIS — Z1231 Encounter for screening mammogram for malignant neoplasm of breast: Secondary | ICD-10-CM | POA: Insufficient documentation

## 2024-05-25 ENCOUNTER — Other Ambulatory Visit: Payer: Self-pay | Admitting: Physician Assistant

## 2024-05-25 DIAGNOSIS — R928 Other abnormal and inconclusive findings on diagnostic imaging of breast: Secondary | ICD-10-CM

## 2024-05-29 ENCOUNTER — Ambulatory Visit
Admission: RE | Admit: 2024-05-29 | Discharge: 2024-05-29 | Disposition: A | Discharge status: Home / Self Care | Source: Ambulatory Visit | Attending: Physician Assistant | Admitting: Physician Assistant

## 2024-05-29 DIAGNOSIS — R928 Other abnormal and inconclusive findings on diagnostic imaging of breast: Secondary | ICD-10-CM

## 2024-05-31 ENCOUNTER — Other Ambulatory Visit: Payer: Self-pay | Admitting: Family Medicine

## 2024-05-31 ENCOUNTER — Other Ambulatory Visit: Payer: Self-pay | Admitting: Physician Assistant

## 2024-05-31 DIAGNOSIS — R928 Other abnormal and inconclusive findings on diagnostic imaging of breast: Secondary | ICD-10-CM

## 2024-06-08 ENCOUNTER — Ambulatory Visit
Admission: RE | Admit: 2024-06-08 | Discharge: 2024-06-08 | Disposition: A | Discharge status: Home / Self Care | Source: Ambulatory Visit | Attending: Family Medicine | Admitting: Family Medicine

## 2024-06-08 ENCOUNTER — Ambulatory Visit
Admission: RE | Admit: 2024-06-08 | Discharge: 2024-06-08 | Disposition: A | Discharge status: Home / Self Care | Source: Ambulatory Visit | Attending: Physician Assistant | Admitting: Physician Assistant

## 2024-06-08 DIAGNOSIS — R928 Other abnormal and inconclusive findings on diagnostic imaging of breast: Secondary | ICD-10-CM

## 2024-06-08 DIAGNOSIS — N6325 Unspecified lump in the left breast, overlapping quadrants: Secondary | ICD-10-CM | POA: Diagnosis present

## 2024-06-08 DIAGNOSIS — N6321 Unspecified lump in the left breast, upper outer quadrant: Secondary | ICD-10-CM | POA: Diagnosis present

## 2024-06-08 DIAGNOSIS — D242 Benign neoplasm of left breast: Secondary | ICD-10-CM | POA: Insufficient documentation

## 2024-06-08 DIAGNOSIS — N6092 Unspecified benign mammary dysplasia of left breast: Secondary | ICD-10-CM | POA: Diagnosis not present

## 2024-06-08 HISTORY — PX: BREAST BIOPSY: SHX20

## 2024-06-08 MED ORDER — LIDOCAINE 1 % OPTIME INJ - NO CHARGE
2.0000 mL | Freq: Once | INTRAMUSCULAR | Status: AC
  Administered 2024-06-08: 2 mL
  Filled 2024-06-08: qty 2

## 2024-06-08 MED ORDER — LIDOCAINE-EPINEPHRINE 1 %-1:100000 IJ SOLN
5.0000 mL | Freq: Once | INTRAMUSCULAR | Status: AC
  Administered 2024-06-08: 5 mL
  Filled 2024-06-08: qty 5

## 2024-06-08 MED ORDER — LIDOCAINE HCL 1 % IJ SOLN
5.0000 mL | Freq: Once | INTRAMUSCULAR | Status: AC
  Administered 2024-06-08: 5 mL
  Filled 2024-06-08: qty 5

## 2024-06-09 LAB — SURGICAL PATHOLOGY

## 2024-06-12 ENCOUNTER — Encounter: Payer: Self-pay | Admitting: *Deleted

## 2024-06-12 NOTE — Progress Notes (Signed)
 Referral recieved from [facility] for benign breast mass. Called Katherine Francis to discuss surgeons, no answer, left VM asking for return call.

## 2024-06-14 ENCOUNTER — Encounter: Payer: Self-pay | Admitting: *Deleted

## 2024-06-14 NOTE — Progress Notes (Signed)
 Katherine Francis will see Dr. Charmel Cooter on 6/24 at 8:15.  VM left with appt details and asking for return call to confirm.

## 2024-06-14 NOTE — Progress Notes (Signed)
 Attempted to call patient to discuss recommendation for surgical consultation.   She did not answer, VM left asking for return call.

## 2024-06-20 ENCOUNTER — Ambulatory Visit: Payer: Self-pay | Admitting: General Surgery

## 2024-06-20 NOTE — H&P (Signed)
 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 She was referred by [place], MD for evaluation of a left breast papilloma.  She underwent a screening mammogram on May 18, 2024, which revealed a suggested asymmetry in the left breast. A subsequent diagnostic mammogram identified a 10 mm indeterminate mass at the site of concern, along with an adjacent 6 mm possible complex cystic or solid mass at the 12 o'clock position. An additional 5 mm mass was noted at the 1 o'clock position, which was biopsied. The biopsy of the 1 o'clock mass showed an intraductal papilloma without atypia or malignancy. The lesion at the 12 o'clock position, 3 cm from the nipple, was identified as a complex sclerosing lesion. The 1 o'clock lesion, 8 cm from the nipple, resolved after aspiration, and the aspirated fluid appeared benign.  I personally evaluated the images.  She has no prior history of breast biopsies. She has a family history of breast cancer; her oldest sister was diagnosed in her late sixties and underwent a mastectomy, although her death was unrelated to cancer.  Her past medical history includes a hysterectomy due to excessive bleeding and clotting, which was benign. She has not taken hormone replacement therapy post-hysterectomy. She had her first menstrual cycle at age 68 and her first pregnancy at age 64, which resulted in a loss. No discharge from the nipple.      PAST MEDICAL HISTORY:  Past Medical History:  Diagnosis Date   Allergy    Arthritis ? Years   Depression Maybe 8 years   Diabetes mellitus type 2, uncomplicated (CMS/HHS-HCC)    GERD (gastroesophageal reflux disease) Couple weeks ago. And before that, a year or so.   Hypercholesteremia    Hypertension    Sleep apnea         PAST SURGICAL HISTORY:   Past Surgical History:  Procedure Laterality Date    HYSTERECTOMY  1998   cervix still in-place   CARPAL TUNNEL RELEASE     CESAREAN SECTION     OOPHORECTOMY  1998   TUBAL LIGATION           MEDICATIONS:  Outpatient Encounter Medications as of 06/20/2024  Medication Sig Dispense Refill   blood glucose diagnostic test strip Use as instructed once daily 100 each 3   blood-glucose meter Misc Please provide patient with glucometer covered by her insurance, preferably [employer]. 1 each 0   enalapriL-hydroCHLOROthiazide (VASERETIC) 10-25 mg tablet Take 1 tablet by mouth once daily 90 tablet 3   fluticasone propionate (FLONASE) 50 mcg/actuation nasal spray SPRAY 2 SPRAYS INTO EACH NOSTRIL EVERY DAY 16 g 3   albuterol  (PROVENTIL ) 2.5 mg /3 mL (0.083 %) nebulizer solution Take 3 mLs (2.5 mg total) by nebulization every 6 (six) hours as needed for Wheezing (Patient not taking: Reported on 03/02/2024) 75 mL 1   albuterol  90 mcg/actuation inhaler Inhale 2 inhalations into the lungs every 4 (four) hours as needed for up to 30 days 1 each 0   atorvastatin (LIPITOR) 40 MG tablet Take 1 tablet (40 mg total) by mouth once daily (Patient not taking: Reported on 06/20/2024) 90 tablet 3   busPIRone (BUSPAR) 5 MG tablet TAKE 1 TABLET BY MOUTH TWICE A DAY (Patient not taking: Reported on 06/20/2024) 200 tablet 2   citalopram (CELEXA) 20 MG tablet Take 1 tablet (20 mg total) by mouth once  daily 90 tablet 3   lancing device with lancets kit Use 1 each once daily Use as instructed. 150 each 3   ONETOUCH ULTRA BLUE TEST STRIP test strip USE 3 (THREE) TIMES DAILY. ONE TOUCH ULTRA METER. DX CODE E11.9 (LIFESCAN) 100 each 0   ZINC SULFATE ORAL Take by mouth once daily (Patient not taking: Reported on 06/20/2024)     No facility-administered encounter medications on file as of 06/20/2024.     ALLERGIES:   Prednisone    SOCIAL HISTORY:  Social History   Socioeconomic History   Marital status: Married  Tobacco Use   Smoking status: Former    Current packs/day:  0.00    Average packs/day: 1.5 packs/day for 26.0 years (39.0 ttl pk-yrs)    Types: Cigarettes    Start date: 12/28/1973    Quit date: 12/29/1999    Years since quitting: 24.4   Smokeless tobacco: Never  Vaping Use   Vaping status: Some Days  Substance and Sexual Activity   Alcohol use: No   Drug use: No   Sexual activity: Not Currently    Birth control/protection: Surgical   Social Drivers of Health   Financial Resource Strain: Medium Risk (06/20/2024)   Overall Financial Resource Strain (CARDIA)    Difficulty of Paying Living Expenses: Somewhat hard  Food Insecurity: Food Insecurity Present (06/20/2024)   Hunger Vital Sign    Worried About Running Out of Food in the Last Year: Sometimes true    Ran Out of Food in the Last Year: Sometimes true  Transportation Needs: No Transportation Needs (06/20/2024)   PRAPARE - Administrator, Civil Service (Medical): No    Lack of Transportation (Non-Medical): No    FAMILY HISTORY:  Family History  Problem Relation Name Age of Onset   Diabetes type II Mother Violet Smith    High blood pressure (Hypertension) Mother Colene Sharps    Stroke Mother Violet Sharps    Coronary Artery Disease (Blocked arteries around heart) Mother Colene Sharps    Glaucoma Mother Colene Sharps        ? states not on gtts   Thyroid disease Mother Colene Sharps    Alcohol abuse Father Valery Sharps    Anxiety Sister Dar    High blood pressure (Hypertension) Sister Dar    Stroke Sister Dar    Alcohol abuse Brother Marinda, Todd and Richard Sharps    Asthma Brother Marinda Todd and Charlie Sharps    Coronary Artery Disease (Blocked arteries around heart) Brother     Alcohol abuse Brother     Asthma Brother     Stroke Sister Niels    Thyroid disease Sister Niels    Alcohol abuse Son D    High blood pressure (Hypertension) Sister     Stroke Sister     Alcohol abuse Son     Asthma Brother Charlie Sharps    Coronary Artery Disease (Blocked arteries around heart)  Brother Charlie Sharps    High blood pressure (Hypertension) Brother Charlie Sharps    Diabetes type II Sister Niels Latina    High blood pressure (Hypertension) Sister Niels Riffe    Obesity Sister Niels Riffe    Stroke Sister Niels Riffe    Thyroid disease Sister Niels Riffe    Diabetes type II Sister Winton Bail    High blood pressure (Hypertension) Sister Winton Bail    Obesity Sister Winton Bail    Stroke Sister Winton Bail    Coronary Artery Disease (Blocked  arteries around heart) Brother     Macular degeneration Neg Hx     Breast cancer Neg Hx     Colon cancer Neg Hx     Hyperlipidemia (Elevated cholesterol) Neg Hx       GENERAL REVIEW OF SYSTEMS:   General ROS: negative for - chills, fatigue, fever, weight gain or weight loss Allergy and Immunology ROS: negative for - hives  Hematological and Lymphatic ROS: negative for - bleeding problems or bruising, negative for palpable nodes Endocrine ROS: negative for - heat or cold intolerance, hair changes Respiratory ROS: negative for - cough, shortness of breath or wheezing Cardiovascular ROS: no chest pain or palpitations GI ROS: negative for nausea, vomiting, abdominal pain, diarrhea, constipation Musculoskeletal ROS: negative for - joint swelling or muscle pain Neurological ROS: negative for - confusion, syncope Dermatological ROS: negative for pruritus and rash  PHYSICAL EXAM:  Vitals:   06/20/24 0838  BP: (!) 158/91  Pulse: 76  .  Ht:155.4 cm (5' 1.18) Wt:76.7 kg (169 lb 1.5 oz) ADJ:Anib surface area is 1.82 meters squared. Body mass index is 31.76 kg/m.[name]   GENERAL: Alert, active, oriented x3  HEENT: Pupils equal reactive to light. Extraocular movements are intact. Sclera clear. Palpebral conjunctiva normal red color.Pharynx clear.  NECK: Supple with no palpable mass and no adenopathy.  LUNGS: Sound clear with no rales rhonchi or wheezes.  HEART: Regular rhythm S1 and S2 without murmur.  BREAST: Both breast  examined in the supine position.  No palpable masses, skin changes, nipple retraction or nipple discharge.  EXTREMITIES: Well-developed well-nourished symmetrical with no dependent edema.  NEUROLOGICAL: Awake alert oriented, facial expression symmetrical, moving all extremities.   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 Surgical excision is necessary to confirm the absence of malignancy. Perform an excisional biopsy of the lesion under general anesthesia, using wire localization to guide the excision. Send the excised tissue to pathology for cancer evaluation.  Intraductal papilloma of left breast   An intraductal papilloma in the left breast shows no atypia or malignancy, indicating low cancer risk. 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 Perform an excisional biopsy of the papilloma under general anesthesia and send the tissue to pathology for evaluation. Instruct on post-operative care, including normal showering with soap and water  due to skin glue application.   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   Lucas Sjogren, MD  Electronically signed  by Lucas Sjogren, MD

## 2024-06-20 NOTE — H&P (View-Only) (Signed)
 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 She was referred by [place], MD for evaluation of a left breast papilloma.  She underwent a screening mammogram on May 18, 2024, which revealed a suggested asymmetry in the left breast. A subsequent diagnostic mammogram identified a 10 mm indeterminate mass at the site of concern, along with an adjacent 6 mm possible complex cystic or solid mass at the 12 o'clock position. An additional 5 mm mass was noted at the 1 o'clock position, which was biopsied. The biopsy of the 1 o'clock mass showed an intraductal papilloma without atypia or malignancy. The lesion at the 12 o'clock position, 3 cm from the nipple, was identified as a complex sclerosing lesion. The 1 o'clock lesion, 8 cm from the nipple, resolved after aspiration, and the aspirated fluid appeared benign.  I personally evaluated the images.  She has no prior history of breast biopsies. She has a family history of breast cancer; her oldest sister was diagnosed in her late sixties and underwent a mastectomy, although her death was unrelated to cancer.  Her past medical history includes a hysterectomy due to excessive bleeding and clotting, which was benign. She has not taken hormone replacement therapy post-hysterectomy. She had her first menstrual cycle at age 68 and her first pregnancy at age 64, which resulted in a loss. No discharge from the nipple.      PAST MEDICAL HISTORY:  Past Medical History:  Diagnosis Date   Allergy    Arthritis ? Years   Depression Maybe 8 years   Diabetes mellitus type 2, uncomplicated (CMS/HHS-HCC)    GERD (gastroesophageal reflux disease) Couple weeks ago. And before that, a year or so.   Hypercholesteremia    Hypertension    Sleep apnea         PAST SURGICAL HISTORY:   Past Surgical History:  Procedure Laterality Date    HYSTERECTOMY  1998   cervix still in-place   CARPAL TUNNEL RELEASE     CESAREAN SECTION     OOPHORECTOMY  1998   TUBAL LIGATION           MEDICATIONS:  Outpatient Encounter Medications as of 06/20/2024  Medication Sig Dispense Refill   blood glucose diagnostic test strip Use as instructed once daily 100 each 3   blood-glucose meter Misc Please provide patient with glucometer covered by her insurance, preferably [employer]. 1 each 0   enalapriL-hydroCHLOROthiazide (VASERETIC) 10-25 mg tablet Take 1 tablet by mouth once daily 90 tablet 3   fluticasone propionate (FLONASE) 50 mcg/actuation nasal spray SPRAY 2 SPRAYS INTO EACH NOSTRIL EVERY DAY 16 g 3   albuterol  (PROVENTIL ) 2.5 mg /3 mL (0.083 %) nebulizer solution Take 3 mLs (2.5 mg total) by nebulization every 6 (six) hours as needed for Wheezing (Patient not taking: Reported on 03/02/2024) 75 mL 1   albuterol  90 mcg/actuation inhaler Inhale 2 inhalations into the lungs every 4 (four) hours as needed for up to 30 days 1 each 0   atorvastatin (LIPITOR) 40 MG tablet Take 1 tablet (40 mg total) by mouth once daily (Patient not taking: Reported on 06/20/2024) 90 tablet 3   busPIRone (BUSPAR) 5 MG tablet TAKE 1 TABLET BY MOUTH TWICE A DAY (Patient not taking: Reported on 06/20/2024) 200 tablet 2   citalopram (CELEXA) 20 MG tablet Take 1 tablet (20 mg total) by mouth once  daily 90 tablet 3   lancing device with lancets kit Use 1 each once daily Use as instructed. 150 each 3   ONETOUCH ULTRA BLUE TEST STRIP test strip USE 3 (THREE) TIMES DAILY. ONE TOUCH ULTRA METER. DX CODE E11.9 (LIFESCAN) 100 each 0   ZINC SULFATE ORAL Take by mouth once daily (Patient not taking: Reported on 06/20/2024)     No facility-administered encounter medications on file as of 06/20/2024.     ALLERGIES:   Prednisone    SOCIAL HISTORY:  Social History   Socioeconomic History   Marital status: Married  Tobacco Use   Smoking status: Former    Current packs/day:  0.00    Average packs/day: 1.5 packs/day for 26.0 years (39.0 ttl pk-yrs)    Types: Cigarettes    Start date: 12/28/1973    Quit date: 12/29/1999    Years since quitting: 24.4   Smokeless tobacco: Never  Vaping Use   Vaping status: Some Days  Substance and Sexual Activity   Alcohol use: No   Drug use: No   Sexual activity: Not Currently    Birth control/protection: Surgical   Social Drivers of Health   Financial Resource Strain: Medium Risk (06/20/2024)   Overall Financial Resource Strain (CARDIA)    Difficulty of Paying Living Expenses: Somewhat hard  Food Insecurity: Food Insecurity Present (06/20/2024)   Hunger Vital Sign    Worried About Running Out of Food in the Last Year: Sometimes true    Ran Out of Food in the Last Year: Sometimes true  Transportation Needs: No Transportation Needs (06/20/2024)   PRAPARE - Administrator, Civil Service (Medical): No    Lack of Transportation (Non-Medical): No    FAMILY HISTORY:  Family History  Problem Relation Name Age of Onset   Diabetes type II Mother Violet Smith    High blood pressure (Hypertension) Mother Colene Sharps    Stroke Mother Violet Sharps    Coronary Artery Disease (Blocked arteries around heart) Mother Colene Sharps    Glaucoma Mother Colene Sharps        ? states not on gtts   Thyroid disease Mother Colene Sharps    Alcohol abuse Father Valery Sharps    Anxiety Sister Dar    High blood pressure (Hypertension) Sister Dar    Stroke Sister Dar    Alcohol abuse Brother Marinda, Todd and Richard Sharps    Asthma Brother Marinda Todd and Charlie Sharps    Coronary Artery Disease (Blocked arteries around heart) Brother     Alcohol abuse Brother     Asthma Brother     Stroke Sister Niels    Thyroid disease Sister Niels    Alcohol abuse Son D    High blood pressure (Hypertension) Sister     Stroke Sister     Alcohol abuse Son     Asthma Brother Charlie Sharps    Coronary Artery Disease (Blocked arteries around heart)  Brother Charlie Sharps    High blood pressure (Hypertension) Brother Charlie Sharps    Diabetes type II Sister Niels Latina    High blood pressure (Hypertension) Sister Niels Riffe    Obesity Sister Niels Riffe    Stroke Sister Niels Riffe    Thyroid disease Sister Niels Riffe    Diabetes type II Sister Winton Bail    High blood pressure (Hypertension) Sister Winton Bail    Obesity Sister Winton Bail    Stroke Sister Winton Bail    Coronary Artery Disease (Blocked  arteries around heart) Brother     Macular degeneration Neg Hx     Breast cancer Neg Hx     Colon cancer Neg Hx     Hyperlipidemia (Elevated cholesterol) Neg Hx       GENERAL REVIEW OF SYSTEMS:   General ROS: negative for - chills, fatigue, fever, weight gain or weight loss Allergy and Immunology ROS: negative for - hives  Hematological and Lymphatic ROS: negative for - bleeding problems or bruising, negative for palpable nodes Endocrine ROS: negative for - heat or cold intolerance, hair changes Respiratory ROS: negative for - cough, shortness of breath or wheezing Cardiovascular ROS: no chest pain or palpitations GI ROS: negative for nausea, vomiting, abdominal pain, diarrhea, constipation Musculoskeletal ROS: negative for - joint swelling or muscle pain Neurological ROS: negative for - confusion, syncope Dermatological ROS: negative for pruritus and rash  PHYSICAL EXAM:  Vitals:   06/20/24 0838  BP: (!) 158/91  Pulse: 76  .  Ht:155.4 cm (5' 1.18) Wt:76.7 kg (169 lb 1.5 oz) ADJ:Anib surface area is 1.82 meters squared. Body mass index is 31.76 kg/m.[name]   GENERAL: Alert, active, oriented x3  HEENT: Pupils equal reactive to light. Extraocular movements are intact. Sclera clear. Palpebral conjunctiva normal red color.Pharynx clear.  NECK: Supple with no palpable mass and no adenopathy.  LUNGS: Sound clear with no rales rhonchi or wheezes.  HEART: Regular rhythm S1 and S2 without murmur.  BREAST: Both breast  examined in the supine position.  No palpable masses, skin changes, nipple retraction or nipple discharge.  EXTREMITIES: Well-developed well-nourished symmetrical with no dependent edema.  NEUROLOGICAL: Awake alert oriented, facial expression symmetrical, moving all extremities.   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 Surgical excision is necessary to confirm the absence of malignancy. Perform an excisional biopsy of the lesion under general anesthesia, using wire localization to guide the excision. Send the excised tissue to pathology for cancer evaluation.  Intraductal papilloma of left breast   An intraductal papilloma in the left breast shows no atypia or malignancy, indicating low cancer risk. 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 Perform an excisional biopsy of the papilloma under general anesthesia and send the tissue to pathology for evaluation. Instruct on post-operative care, including normal showering with soap and water  due to skin glue application.   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   Lucas Sjogren, MD  Electronically signed  by Lucas Sjogren, MD

## 2024-06-23 ENCOUNTER — Other Ambulatory Visit: Payer: Self-pay | Admitting: General Surgery

## 2024-06-23 DIAGNOSIS — N6489 Other specified disorders of breast: Secondary | ICD-10-CM

## 2024-06-23 DIAGNOSIS — D242 Benign neoplasm of left breast: Secondary | ICD-10-CM

## 2024-06-26 ENCOUNTER — Encounter
Admission: RE | Admit: 2024-06-26 | Discharge: 2024-06-26 | Disposition: A | Discharge status: Home / Self Care | Source: Ambulatory Visit | Attending: General Surgery | Admitting: General Surgery

## 2024-06-26 ENCOUNTER — Other Ambulatory Visit: Payer: Self-pay

## 2024-06-26 VITALS — Ht 61.0 in | Wt 169.0 lb

## 2024-06-26 DIAGNOSIS — E119 Type 2 diabetes mellitus without complications: Secondary | ICD-10-CM

## 2024-06-26 DIAGNOSIS — Z01810 Encounter for preprocedural cardiovascular examination: Secondary | ICD-10-CM

## 2024-06-26 DIAGNOSIS — Z01812 Encounter for preprocedural laboratory examination: Secondary | ICD-10-CM

## 2024-06-26 DIAGNOSIS — I10 Essential (primary) hypertension: Secondary | ICD-10-CM

## 2024-06-26 HISTORY — DX: Polyp of colon: K63.5

## 2024-06-26 NOTE — Patient Instructions (Addendum)
 Your procedure is scheduled on:  [facility] JULY 2  Report to the Registration Desk on the 1st floor of the [facility] To find out your arrival time, please call [phone] between 1PM - 3PM on:  TUESDAY JULY 1  If your arrival time is 6:00 am, do not arrive before that time as the [facility] entrance doors do not open until 6:00 am.  REMEMBER: Instructions that are not followed completely may result in serious medical risk, up to and including death; or upon the discretion of your surgeon and anesthesiologist your surgery may need to be rescheduled.  Do not eat food after midnight the night before surgery.  No gum chewing or hard candies.   One week prior to surgery: Stop Anti-inflammatories (NSAIDS) such as Advil , Aleve, Ibuprofen , Motrin , Naproxen, Naprosyn and Aspirin based products such as Excedrin, Goody's Powder, BC Powder. Stop ANY OVER THE COUNTER supplements until after surgery. fluticasone (FLONASE)   You may however, continue to take Tylenol  if needed for pain up until the day of surgery.   Continue taking all of your other prescription medications up until the day of surgery.  ON THE DAY OF SURGERY ONLY TAKE THESE MEDICATIONS WITH SIPS OF WATER :  citalopram (CELEXA)   No Alcohol for 24 hours before or after surgery.  No Smoking including e-cigarettes for 24 hours before surgery.   Do not use any recreational drugs for at least a week (preferably 2 weeks) before your surgery.  Please be advised that the combination of cocaine and anesthesia may have negative outcomes, up to and including death. If you test positive for cocaine, your surgery will be cancelled.  On the morning of surgery brush your teeth with toothpaste and water , you may rinse your mouth with mouthwash if you wish. Do not swallow any toothpaste or mouthwash.  Use CHG Soap as directed on instruction sheet.  Do not wear jewelry, make-up, hairpins, clips or nail polish.  For welded  (permanent) jewelry: bracelets, anklets, waist bands, etc.  Please have this removed prior to surgery.  If it is not removed, there is a chance that hospital personnel will need to cut it off on the day of surgery.  Do not wear lotions, powders, or perfumes.   Do not shave body hair from the neck down 48 hours before surgery.  Contact lenses, hearing aids and dentures may not be worn into surgery.  Do not bring valuables to the hospital. [facility] is not responsible for any missing/lost belongings or valuables.   Notify your doctor if there is any change in your medical condition (cold, fever, infection).  Wear comfortable clothing (specific to your surgery type) to the hospital.  After surgery, you can help prevent lung complications by doing breathing exercises.   If you are being discharged the day of surgery, you will not be allowed to drive home. You will need a responsible individual to drive you home and stay with you for 24 hours after surgery.   If you are taking public transportation, you will need to have a responsible individual with you.  Please call the Pre-admissions Testing Dept. at [phone] if you have any questions about these instructions.  Surgery Visitation Policy:  Patients having surgery or a procedure may have two visitors.  Children under the age of 61 must have an adult with them who is not the patient.  Merchandiser, retail to address health-related social needs:  [email or web address]  Preparing for Surgery with CHLORHEXIDINE GLUCONATE (CHG) Soap  Chlorhexidine Gluconate (CHG) Soap  o An antiseptic cleaner that kills germs and bonds with the skin to continue killing germs even after washing  o Used for showering the night before surgery and morning of surgery  Before surgery, you can play an important role by reducing the number of germs on your skin.  CHG (Chlorhexidine gluconate) soap is an antiseptic  cleanser which kills germs and bonds with the skin to continue killing germs even after washing.  Please do not use if you have an allergy to CHG or antibacterial soaps. If your skin becomes reddened/irritated stop using the CHG.  1. Shower the NIGHT BEFORE SURGERY and the MORNING OF SURGERY with CHG soap.  2. If you choose to wash your hair, wash your hair first as usual with your normal shampoo.  3. After shampooing, rinse your hair and body thoroughly to remove the shampoo.  4. Use CHG as you would any other liquid soap. You can apply CHG directly to the skin and wash gently with a scrungie or a clean washcloth.  5. Apply the CHG soap to your body only from the neck down. Do not use on open wounds or open sores. Avoid contact with your eyes, ears, mouth, and genitals (private parts). Wash face and genitals (private parts) with your normal soap.  6. Wash thoroughly, paying special attention to the area where your surgery will be performed.  7. Thoroughly rinse your body with warm water .  8. Do not shower/wash with your normal soap after using and rinsing off the CHG soap.  9. Pat yourself dry with a clean towel.  10. Wear clean pajamas to bed the night before surgery.  11. Place clean sheets on your bed the night of your first shower and do not sleep with pets.  12. Shower again with the CHG soap on the day of surgery prior to arriving at the hospital.  13. Do not apply any deodorants/lotions/powders.  14. Please wear clean clothes to the hospital.

## 2024-06-27 ENCOUNTER — Encounter
Admission: RE | Admit: 2024-06-27 | Discharge: 2024-06-27 | Disposition: A | Discharge status: Home / Self Care | Source: Ambulatory Visit | Attending: General Surgery | Admitting: General Surgery

## 2024-06-27 DIAGNOSIS — E119 Type 2 diabetes mellitus without complications: Secondary | ICD-10-CM | POA: Insufficient documentation

## 2024-06-27 DIAGNOSIS — I10 Essential (primary) hypertension: Secondary | ICD-10-CM | POA: Insufficient documentation

## 2024-06-27 DIAGNOSIS — Z01810 Encounter for preprocedural cardiovascular examination: Secondary | ICD-10-CM

## 2024-06-27 DIAGNOSIS — Z01818 Encounter for other preprocedural examination: Secondary | ICD-10-CM | POA: Diagnosis present

## 2024-06-27 DIAGNOSIS — Z01812 Encounter for preprocedural laboratory examination: Secondary | ICD-10-CM

## 2024-06-27 LAB — CBC
HCT: 35.2 % — ABNORMAL LOW (ref 36.0–46.0)
Hemoglobin: 11.5 g/dL — ABNORMAL LOW (ref 12.0–15.0)
MCH: 28.5 pg (ref 26.0–34.0)
MCHC: 32.7 g/dL (ref 30.0–36.0)
MCV: 87.3 fL (ref 80.0–100.0)
Platelets: 459 10*3/uL — ABNORMAL HIGH (ref 150–400)
RBC: 4.03 MIL/uL (ref 3.87–5.11)
RDW: 14.3 % (ref 11.5–15.5)
WBC: 9.4 10*3/uL (ref 4.0–10.5)
nRBC: 0 % (ref 0.0–0.2)

## 2024-06-27 LAB — BASIC METABOLIC PANEL WITH GFR
Anion gap: 13 (ref 5–15)
BUN: 12 mg/dL (ref 8–23)
CO2: 21 mmol/L — ABNORMAL LOW (ref 22–32)
Calcium: 9.5 mg/dL (ref 8.9–10.3)
Chloride: 106 mmol/L (ref 98–111)
Creatinine, Ser: 0.56 mg/dL (ref 0.44–1.00)
GFR, Estimated: >60 mL/min (ref >60)
Glucose, Bld: 109 mg/dL — ABNORMAL HIGH (ref 70–99)
Potassium: 3.7 mmol/L (ref 3.5–5.1)
Sodium: 140 mmol/L (ref 135–145)

## 2024-06-28 ENCOUNTER — Ambulatory Visit
Admission: RE | Admit: 2024-06-28 | Discharge: 2024-06-28 | Disposition: A | Discharge status: Home / Self Care | Source: Ambulatory Visit | Attending: General Surgery | Admitting: General Surgery

## 2024-06-28 ENCOUNTER — Ambulatory Visit
Admission: RE | Admit: 2024-06-28 | Discharge: 2024-06-28 | Disposition: A | Discharge status: Home / Self Care | Attending: General Surgery | Admitting: General Surgery

## 2024-06-28 ENCOUNTER — Inpatient Hospital Stay
Admission: RE | Admit: 2024-06-28 | Discharge: 2024-06-28 | Discharge status: Home / Self Care | Source: Ambulatory Visit | Attending: General Surgery

## 2024-06-28 ENCOUNTER — Other Ambulatory Visit: Payer: Self-pay

## 2024-06-28 ENCOUNTER — Ambulatory Visit
Admission: RE | Admit: 2024-06-28 | Discharge: 2024-06-28 | Disposition: A | Discharge status: Home / Self Care | Source: Ambulatory Visit | Attending: General Surgery

## 2024-06-28 ENCOUNTER — Encounter
Admission: RE | Disposition: A | Discharge status: Home / Self Care | Payer: Self-pay | Source: Home / Self Care | Attending: General Surgery

## 2024-06-28 ENCOUNTER — Other Ambulatory Visit: Payer: Self-pay | Admitting: General Surgery

## 2024-06-28 ENCOUNTER — Encounter: Admitting: Urgent Care

## 2024-06-28 DIAGNOSIS — N6489 Other specified disorders of breast: Secondary | ICD-10-CM

## 2024-06-28 DIAGNOSIS — N6022 Fibroadenosis of left breast: Secondary | ICD-10-CM | POA: Diagnosis not present

## 2024-06-28 DIAGNOSIS — D242 Benign neoplasm of left breast: Secondary | ICD-10-CM | POA: Diagnosis present

## 2024-06-28 DIAGNOSIS — N6092 Unspecified benign mammary dysplasia of left breast: Secondary | ICD-10-CM | POA: Insufficient documentation

## 2024-06-28 DIAGNOSIS — F17290 Nicotine dependence, other tobacco product, uncomplicated: Secondary | ICD-10-CM | POA: Insufficient documentation

## 2024-06-28 DIAGNOSIS — Z803 Family history of malignant neoplasm of breast: Secondary | ICD-10-CM | POA: Insufficient documentation

## 2024-06-28 DIAGNOSIS — Z7951 Long term (current) use of inhaled steroids: Secondary | ICD-10-CM | POA: Diagnosis not present

## 2024-06-28 DIAGNOSIS — Z01812 Encounter for preprocedural laboratory examination: Secondary | ICD-10-CM

## 2024-06-28 DIAGNOSIS — Z79899 Other long term (current) drug therapy: Secondary | ICD-10-CM | POA: Diagnosis not present

## 2024-06-28 DIAGNOSIS — G4730 Sleep apnea, unspecified: Secondary | ICD-10-CM | POA: Diagnosis not present

## 2024-06-28 DIAGNOSIS — E119 Type 2 diabetes mellitus without complications: Secondary | ICD-10-CM | POA: Diagnosis not present

## 2024-06-28 DIAGNOSIS — I10 Essential (primary) hypertension: Secondary | ICD-10-CM | POA: Insufficient documentation

## 2024-06-28 HISTORY — PX: BREAST BIOPSY: SHX20

## 2024-06-28 HISTORY — PX: BREAST LUMPECTOMY: SHX2

## 2024-06-28 LAB — GLUCOSE, CAPILLARY
Glucose-Capillary: 133 mg/dL — ABNORMAL HIGH (ref 70–99)
Glucose-Capillary: 137 mg/dL — ABNORMAL HIGH (ref 70–99)

## 2024-06-28 SURGERY — BREAST LUMPECTOMY
Anesthesia: General | Laterality: Left

## 2024-06-28 MED ORDER — HYDROMORPHONE HCL 1 MG/ML IJ SOLN
INTRAMUSCULAR | Status: DC | PRN
  Administered 2024-06-28: .2 mg via INTRAVENOUS

## 2024-06-28 MED ORDER — CHLORHEXIDINE GLUCONATE 0.12 % MT SOLN
15.0000 mL | Freq: Once | OROMUCOSAL | Status: AC
Start: 2024-06-28 — End: 2024-06-28
  Administered 2024-06-28: 15 mL via OROMUCOSAL

## 2024-06-28 MED ORDER — MIDAZOLAM HCL 2 MG/2ML IJ SOLN
INTRAMUSCULAR | Status: DC | PRN
  Administered 2024-06-28: 2 mg via INTRAVENOUS

## 2024-06-28 MED ORDER — SODIUM CHLORIDE 0.9 % IV SOLN
INTRAVENOUS | Status: DC
Start: 2024-06-28 — End: 2024-06-28

## 2024-06-28 MED ORDER — CEFAZOLIN SODIUM-DEXTROSE 2-4 GM/100ML-% IV SOLN
2.0000 g | INTRAVENOUS | Status: AC
  Administered 2024-06-28: 2 g via INTRAVENOUS

## 2024-06-28 MED ORDER — ACETAMINOPHEN 10 MG/ML IV SOLN
INTRAVENOUS | Status: AC
  Filled 2024-06-28: qty 100

## 2024-06-28 MED ORDER — FENTANYL CITRATE (PF) 100 MCG/2ML IJ SOLN: 25.0000 ug | INTRAMUSCULAR | Status: DC | PRN

## 2024-06-28 MED ORDER — BUPIVACAINE-EPINEPHRINE 0.5% -1:200000 IJ SOLN
INTRAMUSCULAR | Status: DC | PRN
  Administered 2024-06-28: 30 mL

## 2024-06-28 MED ORDER — FENTANYL CITRATE (PF) 100 MCG/2ML IJ SOLN
INTRAMUSCULAR | Status: DC | PRN
  Administered 2024-06-28 (×2): 50 ug via INTRAVENOUS

## 2024-06-28 MED ORDER — LIDOCAINE HCL (PF) 1 % IJ SOLN: 10.0000 mL | Freq: Once | INTRAMUSCULAR | Status: DC

## 2024-06-28 MED ORDER — STERILE WATER FOR IRRIGATION IR SOLN
Status: DC | PRN
  Administered 2024-06-28: 500 mL

## 2024-06-28 MED ORDER — HYDROCODONE-ACETAMINOPHEN 5-325 MG PO TABS: 1.0000 | ORAL_TABLET | Freq: Four times a day (QID) | ORAL | 0 refills | Status: AC | PRN

## 2024-06-28 MED ORDER — DEXAMETHASONE SODIUM PHOSPHATE 10 MG/ML IJ SOLN
INTRAMUSCULAR | Status: AC
Start: 2024-06-28 — End: 2024-06-28
  Filled 2024-06-28: qty 1

## 2024-06-28 MED ORDER — PROPOFOL 10 MG/ML IV BOLUS
INTRAVENOUS | Status: DC | PRN
  Administered 2024-06-28: 140 mg via INTRAVENOUS

## 2024-06-28 MED ORDER — PROPOFOL 10 MG/ML IV BOLUS
INTRAVENOUS | Status: AC
Start: 2024-06-28 — End: 2024-06-28
  Filled 2024-06-28: qty 20

## 2024-06-28 MED ORDER — ONDANSETRON HCL 4 MG/2ML IJ SOLN
INTRAMUSCULAR | Status: AC
  Filled 2024-06-28: qty 2

## 2024-06-28 MED ORDER — LIDOCAINE HCL (CARDIAC) PF 100 MG/5ML IV SOSY
PREFILLED_SYRINGE | INTRAVENOUS | Status: DC | PRN
  Administered 2024-06-28: 50 mg via INTRAVENOUS

## 2024-06-28 MED ORDER — ACETAMINOPHEN 10 MG/ML IV SOLN
INTRAVENOUS | Status: DC | PRN
  Administered 2024-06-28: 1000 mg via INTRAVENOUS

## 2024-06-28 MED ORDER — ONDANSETRON HCL 4 MG/2ML IJ SOLN
INTRAMUSCULAR | Status: DC | PRN
  Administered 2024-06-28: 4 mg via INTRAVENOUS

## 2024-06-28 MED ORDER — CEFAZOLIN SODIUM-DEXTROSE 2-4 GM/100ML-% IV SOLN
INTRAVENOUS | Status: AC
  Filled 2024-06-28: qty 100

## 2024-06-28 MED ORDER — HYDROMORPHONE HCL 1 MG/ML IJ SOLN
INTRAMUSCULAR | Status: AC
  Filled 2024-06-28: qty 1

## 2024-06-28 MED ORDER — OXYCODONE HCL 5 MG/5ML PO SOLN: 5.0000 mg | Freq: Once | ORAL | Status: AC | PRN

## 2024-06-28 MED ORDER — BUPIVACAINE-EPINEPHRINE (PF) 0.5% -1:200000 IJ SOLN
INTRAMUSCULAR | Status: AC
Start: 2024-06-28 — End: 2024-06-28
  Filled 2024-06-28: qty 30

## 2024-06-28 MED ORDER — LIDOCAINE HCL (PF) 1 % IJ SOLN
5.0000 mL | Freq: Once | INTRAMUSCULAR | Status: AC
  Administered 2024-06-28: 5 mL

## 2024-06-28 MED ORDER — CHLORHEXIDINE GLUCONATE 0.12 % MT SOLN
OROMUCOSAL | Status: AC
  Filled 2024-06-28: qty 15

## 2024-06-28 MED ORDER — ORAL CARE MOUTH RINSE: 15.0000 mL | Freq: Once | OROMUCOSAL | Status: AC

## 2024-06-28 MED ORDER — DEXAMETHASONE SODIUM PHOSPHATE 10 MG/ML IJ SOLN
INTRAMUSCULAR | Status: DC | PRN
  Administered 2024-06-28: 10 mg via INTRAVENOUS

## 2024-06-28 MED ORDER — FENTANYL CITRATE (PF) 100 MCG/2ML IJ SOLN
INTRAMUSCULAR | Status: AC
  Filled 2024-06-28: qty 2

## 2024-06-28 MED ORDER — LIDOCAINE HCL (PF) 2 % IJ SOLN
INTRAMUSCULAR | Status: AC
Start: 2024-06-28 — End: 2024-06-28
  Filled 2024-06-28: qty 5

## 2024-06-28 MED ORDER — PHENYLEPHRINE 80 MCG/ML (10ML) SYRINGE FOR IV PUSH (FOR BLOOD PRESSURE SUPPORT)
PREFILLED_SYRINGE | INTRAVENOUS | Status: DC | PRN
  Administered 2024-06-28 (×2): 80 ug via INTRAVENOUS

## 2024-06-28 MED ORDER — MIDAZOLAM HCL 2 MG/2ML IJ SOLN
INTRAMUSCULAR | Status: AC
  Filled 2024-06-28: qty 2

## 2024-06-28 MED ORDER — EPHEDRINE 5 MG/ML INJ
INTRAVENOUS | Status: AC
  Filled 2024-06-28: qty 5

## 2024-06-28 MED ORDER — OXYCODONE HCL 5 MG PO TABS
5.0000 mg | ORAL_TABLET | Freq: Once | ORAL | Status: AC | PRN
  Administered 2024-06-28: 5 mg via ORAL

## 2024-06-28 MED ORDER — OXYCODONE HCL 5 MG PO TABS
ORAL_TABLET | ORAL | Status: AC
  Filled 2024-06-28: qty 1

## 2024-06-28 MED ORDER — EPHEDRINE SULFATE-NACL 50-0.9 MG/10ML-% IV SOSY
PREFILLED_SYRINGE | INTRAVENOUS | Status: DC | PRN
  Administered 2024-06-28 (×2): 5 mg via INTRAVENOUS
  Administered 2024-06-28: 10 mg via INTRAVENOUS

## 2024-06-28 SURGICAL SUPPLY — 28 items
BLADE SURG 15 STRL LF DISP TIS (BLADE) ×1 IMPLANT
CHLORAPREP W/TINT 26 (MISCELLANEOUS) IMPLANT
DERMABOND ADVANCED .7 DNX12 (GAUZE/BANDAGES/DRESSINGS) ×1 IMPLANT
DEVICE DUBIN SPECIMEN MAMMOGRA (MISCELLANEOUS) ×1 IMPLANT
DRAPE LAPAROTOMY 77X122 PED (DRAPES) ×1 IMPLANT
ELECTRODE REM PT RTRN 9FT ADLT (ELECTROSURGICAL) ×1 IMPLANT
GAUZE 4X4 16PLY ~~LOC~~+RFID DBL (SPONGE) IMPLANT
GLOVE BIO SURGEON STRL SZ 6.5 (GLOVE) ×1 IMPLANT
GLOVE BIOGEL PI IND STRL 6.5 (GLOVE) ×1 IMPLANT
GLOVE SURG SYN 6.5 ES PF (GLOVE) ×2 IMPLANT
GLOVE SURG SYN 6.5 PF PI (GLOVE) ×2 IMPLANT
GOWN STRL REUS W/ TWL LRG LVL3 (GOWN DISPOSABLE) ×3 IMPLANT
KIT MARKER MARGIN INK (KITS) IMPLANT
KIT TURNOVER KIT A (KITS) ×1 IMPLANT
LABEL OR SOLS (LABEL) ×1 IMPLANT
MANIFOLD NEPTUNE II (INSTRUMENTS) ×1 IMPLANT
MARKER MARGIN CORRECT CLIP (MARKER) IMPLANT
NDL HYPO 25X1 1.5 SAFETY (NEEDLE) ×1 IMPLANT
NEEDLE HYPO 25X1 1.5 SAFETY (NEEDLE) ×1 IMPLANT
PACK BASIN MINOR ARMC (MISCELLANEOUS) ×1 IMPLANT
RETRACTOR RING XSMALL (MISCELLANEOUS) IMPLANT
SUT SILK 2 0 SH (SUTURE) IMPLANT
SUT VIC AB 3-0 SH 27X BRD (SUTURE) ×1 IMPLANT
SUTURE MNCRL 4-0 27XMF (SUTURE) ×1 IMPLANT
SYR 10ML LL (SYRINGE) ×1 IMPLANT
TRAP FLUID SMOKE EVACUATOR (MISCELLANEOUS) ×1 IMPLANT
WATER STERILE IRR 1000ML POUR (IV SOLUTION) ×1 IMPLANT
WATER STERILE IRR 500ML POUR (IV SOLUTION) ×1 IMPLANT

## 2024-06-28 NOTE — Anesthesia Preprocedure Evaluation (Signed)
 Anesthesia Evaluation  Patient identified by MRN, date of birth, ID band Patient awake    Reviewed: Allergy & Precautions, NPO status , Patient's Chart, lab work & pertinent test results  History of Anesthesia Complications Negative for: history of anesthetic complications  Airway Mallampati: III  TM Distance: <3 FB Neck ROM: full    Dental  (+) Chipped, Poor Dentition, Missing, Partial Upper   Pulmonary neg shortness of breath, sleep apnea , former smoker   Pulmonary exam normal        Cardiovascular Exercise Tolerance: Good hypertension, (-) angina (-) Past MI and (-) DOE Normal cardiovascular exam     Neuro/Psych  PSYCHIATRIC DISORDERS      negative neurological ROS     GI/Hepatic negative GI ROS, Neg liver ROS,neg GERD  ,,  Endo/Other  diabetes, Type 2    Renal/GU      Musculoskeletal   Abdominal   Peds  Hematology negative hematology ROS (+)   Anesthesia Other Findings Past Medical History: 08/13/2011: Allergic rhinitis No date: Anxiety No date: Arthritis     Comment:  fingers and back No date: Diabetes mellitus without complication (HCC) 01/11/2015: Ganglion of knee, right No date: Hyperlipidemia No date: Hypertension 05/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, recurrent, mild (HCC) 08/28/2015: OSA (obstructive sleep apnea) No date: Polyp of descending colon 08/13/2011: S/P TAH-BSO No date: Sleep apnea     Comment:  no CPAP 08/13/2011: Tinnitus No date: Wears partial dentures     Comment:  upper  Past Surgical History: 06/08/2024: BREAST BIOPSY; Left     Comment:  US  LT BREAST BX W LOC DEV 1ST LESION IMG BX SPEC US                GUIDE 06/08/2024 [facility] 06/08/2024: BREAST BIOPSY; Left     Comment:  US  LT BREAST BX W LOC DEV EA ADD LESION IMG BX SPEC US                GUIDE 06/08/2024 [facility] No date: CESAREAN SECTION 05/12/2021: COLONOSCOPY WITH PROPOFOL ; N/A     Comment:  Procedure:  COLONOSCOPY WITH BIOPSY;  Surgeon: Jinny Carmine, MD;  Location: [facility];  Service:               Endoscopy;  Laterality: N/A;  Needs COVID testing 2014: COLPOSCOPY No date: HYSTERECTOMY ABDOMINAL WITH SALPINGECTOMY 05/12/2021: POLYPECTOMY; N/A     Comment:  Procedure: POLYPECTOMY;  Surgeon: Jinny Carmine, MD;                Location: [facility];  Service: Endoscopy;                Laterality: N/A;  BMI    Body Mass Index: 31.93 kg/m      Reproductive/Obstetrics negative OB ROS                              Anesthesia Physical Anesthesia Plan  ASA: 3  Anesthesia Plan: General LMA   Post-op Pain Management:    Induction: Intravenous  PONV Risk Score and Plan: Dexamethasone, Ondansetron , Midazolam and Treatment may vary due to age or medical condition  Airway Management Planned: LMA  Additional Equipment:   Intra-op Plan:   Post-operative Plan: Extubation in OR  Informed Consent: I have reviewed the patients History and Physical, chart, labs and discussed the procedure including the risks,  benefits and alternatives for the proposed anesthesia with the patient or authorized representative who has indicated his/her understanding and acceptance.     Dental Advisory Given  Plan Discussed with: Anesthesiologist, CRNA and Surgeon  Anesthesia Plan Comments: (Patient consented for risks of anesthesia including but not limited to:  - adverse reactions to medications - damage to eyes, teeth, lips or other oral mucosa - nerve damage due to positioning  - sore throat or hoarseness - Damage to heart, brain, nerves, lungs, other parts of body or loss of life  Patient voiced understanding and assent.)        Anesthesia Quick Evaluation

## 2024-06-28 NOTE — Discharge Instructions (Signed)

## 2024-06-28 NOTE — Transfer of Care (Signed)
 Immediate Anesthesia Transfer of Care Note  Patient: Katherine Francis  Procedure(s) Performed: BREAST LUMPECTOMY (Left)  Patient Location: PACU  Anesthesia Type:General  Level of Consciousness: drowsy and patient cooperative  Airway & Oxygen Therapy: Patient Spontanous Breathing and Patient connected to nasal cannula oxygen  Post-op Assessment: Report given to RN and Post -op Vital signs reviewed and stable  Post vital signs: Reviewed and stable  Last Vitals:  Vitals Value Taken Time  BP 125/66 06/28/24 14:16  Temp    Pulse 90 06/28/24 14:16  Resp 14 06/28/24 14:16  SpO2 97 % 06/28/24 14:16  Vitals shown include unfiled device data.  Last Pain:  Vitals:   06/28/24 1416  TempSrc:   PainSc: 0-No pain         Complications: No notable events documented.

## 2024-06-28 NOTE — Op Note (Signed)
 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  Postoperative diagnosis: Same.   Procedure: Left needle-localized excisional biopsy.                      Anesthesia: GETA  Surgeon: Dr. Cesar Coe  Wound Classification: Clean  Indications: Patient is a 68 y.o. female with two nonpalpable left breast masses noted on mammography with core biopsy demonstrating intraductal papilloma requires needle-localized excisional biopsy to rule out malignancy.   Findings: 1. Specimen mammography shows marker and tag on specimen 2. No other palpable mass or lymph node identified.   Description of procedure: Preoperative needle localization was performed by radiology. The patient was taken to the operating room and placed supine on the operating table, and after general anesthesia the left chest was prepped and draped in the usual sterile fashion. A time-out was completed verifying correct patient, procedure, site, positioning, and implant(s) and/or special equipment prior to beginning this procedure.  By comparing the localization studies, the probable trajectory and location of the mass was visualized. A circumareolar skin incision was planned in such a way as to minimize the amount of dissection to reach the mass.  The skin incision was made. Flaps were raised and the location of the tag was confirmed with Localizer device confirmed. A 2-0 silk figure-of-eight stay suture was placed and used for retraction. Dissection was then taken down circumferentially, taking care to include the wire and a wide margin of grossly normal tissue. The specimen and entire wire were removed. The specimen was oriented and sent to radiology with the localization studies. Confirmation was received that the entire target lesion had been resected. The wound was irrigated. Hemostasis was checked. The wound was closed with interrupted sutures of 3-0 Vicryl and a subcuticular suture of  Monocryl 3-0. No attempt was made to close the dead space.   Specimen: Left excisional biopsy                      Complications: None  Estimated Blood Loss: 5 mL

## 2024-06-28 NOTE — Anesthesia Procedure Notes (Signed)
 Procedure Name: LMA Insertion Date/Time: 06/28/2024 1:07 PM  Performed by: Lorrene Camelia LABOR, CRNAPre-anesthesia Checklist: Patient identified, Patient being monitored, Timeout performed, Emergency Drugs available and Suction available Patient Re-evaluated:Patient Re-evaluated prior to induction Oxygen Delivery Method: Circle system utilized Preoxygenation: Pre-oxygenation with 100% oxygen Induction Type: IV induction Ventilation: Mask ventilation without difficulty LMA: LMA inserted LMA Size: 4.0 Tube type: Oral Number of attempts: 1 Placement Confirmation: positive ETCO2 and breath sounds checked- equal and bilateral Tube secured with: Tape Dental Injury: Teeth and Oropharynx as per pre-operative assessment

## 2024-06-28 NOTE — Interval H&P Note (Signed)
 History and Physical Interval Note:  06/28/2024 12:29 PM  Katherine Francis  has presented today for surgery, with the diagnosis of D24.2 Intraductal papilloma of breast, lt N64.89 CSL of lt breast.  The various methods of treatment have been discussed with the patient and family. After consideration of risks, benefits and other options for treatment, the patient has consented to  Procedure(s) with comments: BREAST LUMPECTOMY (Left) - WITH NEEDLE LOCALIZATION as a surgical intervention.  The patient's history has been reviewed, patient examined, no change in status, stable for surgery.  I have reviewed the patient's chart and labs.  Questions were answered to the patient's satisfaction.     Lucas Sjogren

## 2024-06-29 ENCOUNTER — Encounter: Payer: Self-pay | Admitting: General Surgery

## 2024-06-29 NOTE — Anesthesia Postprocedure Evaluation (Signed)
 Anesthesia Post Note  Patient: Katherine Francis  Procedure(s) Performed: BREAST LUMPECTOMY (Left)  Patient location during evaluation: PACU Anesthesia Type: General Level of consciousness: awake and alert Pain management: pain level controlled Vital Signs Assessment: post-procedure vital signs reviewed and stable Respiratory status: spontaneous breathing, nonlabored ventilation and respiratory function stable Cardiovascular status: blood pressure returned to baseline and stable Postop Assessment: no apparent nausea or vomiting Anesthetic complications: no   No notable events documented.   Last Vitals:  Vitals:   06/28/24 1445 06/28/24 1455  BP: 137/64 (!) 156/77  Pulse: 89 91  Resp: 15 16  Temp: 36.7 C 36.7 C  SpO2: 98% 96%    Last Pain:  Vitals:   06/28/24 1455  TempSrc: Temporal  PainSc: 3                  Fairy POUR Kambria Grima

## 2024-07-04 LAB — SURGICAL PATHOLOGY

## 2024-07-11 NOTE — Progress Notes (Signed)
   History of Present Illness Katherine Francis is a 68 year old female who presents for postoperative evaluation after excision biopsy of the left breast.  She underwent an excision biopsy of the left breast on June 28, 2024, for an intraductal papilloma. The final pathology report indicated the presence of intraductal papilloma with atypical ductal hyperplasia, but it was negative for malignancy.  Atypical ductal hyperplasia was identified on the final pathology report.   PAST SURGICAL HISTORY:   Past Surgical History:  Procedure Laterality Date  . HYSTERECTOMY  1998   cervix still in-place  . MASTECTOMY PARTIAL Left 06/28/2024   Dr Lucas Catchings -- w/ NL  . CARPAL TUNNEL RELEASE    . CESAREAN SECTION    . OOPHORECTOMY  1998  . TUBAL LIGATION           PHYSICAL EXAM:  Vitals:   07/11/24 0901  BP: (!) 140/89  Pulse: 89  .  Ht:154.9 cm (5' 1) Wt:76.7 kg (169 lb) ADJ:Anib surface area is 1.82 meters squared. Body mass index is 31.93 kg/m.[name]   GENERAL: Alert, active, oriented x3  BREAST: The wound is healing well, no cellulitis, minimal fluid collection.  No palpable masses.   Assessment & Plan Intraductal papilloma of left breast   Status post excision biopsy on June 28, 2024, revealed an intraductal papilloma with atypical ductal hyperplasia. Pathology is negative for malignancy.  Atypical ductal hyperplasia of left breast   Atypical ductal hyperplasia was identified in the excised intraductal papilloma, increasing her lifetime risk of breast cancer. Refer to medical oncology for discussion of risk-reducing therapies, such as endocrine therapy.  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   Lucas Sjogren, MD

## 2024-07-28 ENCOUNTER — Inpatient Hospital Stay: Admitting: Oncology

## 2024-07-28 ENCOUNTER — Inpatient Hospital Stay

## 2024-08-11 ENCOUNTER — Inpatient Hospital Stay

## 2024-08-11 ENCOUNTER — Inpatient Hospital Stay: Attending: Oncology | Admitting: Oncology

## 2024-08-11 ENCOUNTER — Encounter: Payer: Self-pay | Admitting: Oncology

## 2024-08-11 VITALS — BP 184/82 | HR 77 | Temp 98.8°F | Resp 18 | Wt 173.7 lb

## 2024-08-11 DIAGNOSIS — Z1379 Encounter for other screening for genetic and chromosomal anomalies: Secondary | ICD-10-CM | POA: Diagnosis not present

## 2024-08-11 DIAGNOSIS — R1013 Epigastric pain: Secondary | ICD-10-CM | POA: Diagnosis not present

## 2024-08-11 DIAGNOSIS — Z90722 Acquired absence of ovaries, bilateral: Secondary | ICD-10-CM | POA: Insufficient documentation

## 2024-08-11 DIAGNOSIS — N6099 Unspecified benign mammary dysplasia of unspecified breast: Secondary | ICD-10-CM

## 2024-08-11 DIAGNOSIS — Z800 Family history of malignant neoplasm of digestive organs: Secondary | ICD-10-CM | POA: Diagnosis not present

## 2024-08-11 DIAGNOSIS — Z90710 Acquired absence of both cervix and uterus: Secondary | ICD-10-CM | POA: Insufficient documentation

## 2024-08-11 DIAGNOSIS — Z139 Encounter for screening, unspecified: Secondary | ICD-10-CM

## 2024-08-11 DIAGNOSIS — N6002 Solitary cyst of left breast: Secondary | ICD-10-CM | POA: Diagnosis present

## 2024-08-11 DIAGNOSIS — Z801 Family history of malignant neoplasm of trachea, bronchus and lung: Secondary | ICD-10-CM | POA: Insufficient documentation

## 2024-08-11 DIAGNOSIS — E119 Type 2 diabetes mellitus without complications: Secondary | ICD-10-CM | POA: Insufficient documentation

## 2024-08-11 DIAGNOSIS — M129 Arthropathy, unspecified: Secondary | ICD-10-CM | POA: Insufficient documentation

## 2024-08-11 DIAGNOSIS — I10 Essential (primary) hypertension: Secondary | ICD-10-CM | POA: Diagnosis not present

## 2024-08-11 DIAGNOSIS — G4730 Sleep apnea, unspecified: Secondary | ICD-10-CM | POA: Insufficient documentation

## 2024-08-11 DIAGNOSIS — Z79899 Other long term (current) drug therapy: Secondary | ICD-10-CM | POA: Diagnosis not present

## 2024-08-11 DIAGNOSIS — E785 Hyperlipidemia, unspecified: Secondary | ICD-10-CM | POA: Diagnosis not present

## 2024-08-11 DIAGNOSIS — Z780 Asymptomatic menopausal state: Secondary | ICD-10-CM | POA: Diagnosis not present

## 2024-08-11 DIAGNOSIS — Z860100 Personal history of colon polyps, unspecified: Secondary | ICD-10-CM | POA: Insufficient documentation

## 2024-08-11 DIAGNOSIS — G4733 Obstructive sleep apnea (adult) (pediatric): Secondary | ICD-10-CM | POA: Diagnosis not present

## 2024-08-11 DIAGNOSIS — F17210 Nicotine dependence, cigarettes, uncomplicated: Secondary | ICD-10-CM | POA: Insufficient documentation

## 2024-08-11 DIAGNOSIS — Z803 Family history of malignant neoplasm of breast: Secondary | ICD-10-CM | POA: Diagnosis not present

## 2024-08-11 DIAGNOSIS — Z809 Family history of malignant neoplasm, unspecified: Secondary | ICD-10-CM

## 2024-08-12 DIAGNOSIS — Z780 Asymptomatic menopausal state: Secondary | ICD-10-CM | POA: Insufficient documentation

## 2024-08-12 DIAGNOSIS — N6099 Unspecified benign mammary dysplasia of unspecified breast: Secondary | ICD-10-CM | POA: Insufficient documentation

## 2024-08-12 DIAGNOSIS — Z809 Family history of malignant neoplasm, unspecified: Secondary | ICD-10-CM | POA: Insufficient documentation

## 2024-08-12 DIAGNOSIS — R1013 Epigastric pain: Secondary | ICD-10-CM | POA: Insufficient documentation

## 2024-08-12 NOTE — Assessment & Plan Note (Signed)
 Obtain baseline bone density  recommend calcium and vitamin D supplementation

## 2024-08-12 NOTE — Assessment & Plan Note (Signed)
 Referral to GI for evaluation

## 2024-08-12 NOTE — Progress Notes (Signed)
 Hematology/Oncology Consult note [phone] [phone]        REFERRING PROVIDER: Rodolph Romano, *   CHIEF COMPLAINTS/REASON FOR VISIT:  Evaluation of left breast ADH   ASSESSMENT & PLAN:   Atypical ductal hyperplasia of breast Atypical ductal hyperplasia is associated with a generalized, bilateral increase in breast cancer risk. Recommendation  Active Surveillance: life time annual screening mammography  Option of endocrine therapy as chemoprevention was discussed. -Obtain baseline bone density Refer to genetic counselor.  If lifetime breast cancer risk is above the 20% consider MRI alternating with mammogram for surveillance.   Family history of cancer Refer to genetic counselor  Epigastric pain Referral to GI for evaluation  Postmenopausal Obtain baseline bone density  recommend calcium and vitamin D supplementation   Orders Placed This Encounter  Procedures   DG Bone Density    Standing Status:   Future    Expected Date:   08/18/2024    Expiration Date:   08/11/2025    Reason for Exam (SYMPTOM  OR DIAGNOSIS REQUIRED):   postmenopausal state    Preferred imaging location?:   [facility]   Ambulatory referral to Social Work    Referral Priority:   Routine    Referral Type:   Consultation    Referral Reason:   Specialty Services Required    Number of Visits Requested:   1   Ambulatory referral to Genetics    Referral Priority:   Routine    Referral Type:   Consultation    Referral Reason:   Specialty Services Required    Referred to Provider:   Allyn Rattan T    Number of Visits Requested:   1   Ambulatory referral to Gastroenterology    Referral Priority:   Routine    Referral Type:   Consultation    Referral Reason:   Specialty Services Required    Number of Visits Requested:   1   Follow-up in 3 months. All questions were answered. The patient knows to call the clinic with any problems, questions or  concerns.  Zelphia Cap, MD, PhD [facility] Hematology Oncology 08/11/2024   HISTORY OF PRESENTING ILLNESS:   Katherine Francis is a  68 y.o.  female with PMH listed below was seen in consultation at the request of  Rodolph Romano, *  for evaluation of atypical ductal hyperplasia.  05/18/2024, patient had a bilateral screening mammogram which showed asymmetry and possible mass in the left breast. 05/29/2024, diagnostic left breast mammogram showed 10 mm indeterminate mass at the site of screening mammogram concern.  Adjacent indeterminate 6 mm possible complex cystic and solid versus complicated cystic mass in the 12:00.  Indeterminate 5 mm mass at 1:00 8 cm from nipple which may reflect a complicated cyst.  No suspicious left axillary adenopathy.  06/08/2024, left breast biopsy  1:00 8 cm from the nipple benign cyst was aspirated. Pathology showed  1. Breast, left, needle core biopsy, 1 o'clock, 4cmfn, heart :       - INTRADUCTAL PAPILLOMA       - NEGATIVE FOR ATYPIA OR MALIGNANCY   2. Breast, left, needle core biopsy, 12 o'clock, 3cmfn, coil :       - COMPLEX CLOSING LESION (2 MM)       - DETACHED FRAGMENT OF PAPILLARY PROLIFERATION WITH FEATURES SUGGESTIVE OF  INTRADUCTAL PAPILLOMA WITH ATYPICAL DUCTAL HYPERPLASIA (1 MM)       - NO MALIGNANCY IDENTIFIED   06/28/2024, patient underwent lumpectomy  1. Breast,  lumpectomy, left excision :       - INTRADUCTAL PAPILLOMA.       - ATYPICAL DUCTAL HYPERPLASIA, FOCAL.       - USUAL DUCTAL HYPERPLASIA WITH FIBROCYSTIC CHANGES WITH LOCALIZED APOCRINE       METAPLASIA.       - FIBROADENOMA.       - CALCIFICATIONS, FOCAL.       - BIOPSY SITE CHANGES, FAT NECROSIS AND LOCALIZED ACUTE INFLAMMATION.       - NEGATIVE FOR MALIGNANCY   Patient presents to discuss pathology report and management plan.  She reports having gastric dull pain which is a chronic issue for her.  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   OSA (obstructive sleep apnea) 08/28/2015   Polyp of descending colon    S/P TAH-BSO 08/13/2011   Sleep apnea    no CPAP   Tinnitus 08/13/2011   Wears partial dentures    upper    SURGICAL HISTORY: Past Surgical History:  Procedure Laterality Date   BREAST BIOPSY Left 06/08/2024   US  LT BREAST BX W LOC DEV 1ST LESION IMG BX SPEC US  GUIDE 06/08/2024 [facility]   BREAST BIOPSY Left 06/08/2024   US  LT BREAST BX W LOC DEV EA ADD LESION IMG BX SPEC US  GUIDE 06/08/2024 [facility]   BREAST BIOPSY Left 06/28/2024   US  LT PLC BREAST LOC DEV   1ST LESION  INC US  GUIDE 06/28/2024 [facility]   BREAST BIOPSY Left 06/28/2024   US  LT PLC BREAST LOC DEV   EA ADD LESION  INC US  GUIDE 06/28/2024 [facility]   BREAST LUMPECTOMY Left 06/28/2024   Procedure: BREAST LUMPECTOMY;  Surgeon: Rodolph Romano, MD;  Location: [facility];  Service: General;  Laterality: Left;  WITH NEEDLE LOCALIZATION   CESAREAN SECTION     COLONOSCOPY WITH PROPOFOL  N/A 05/12/2021   Procedure: COLONOSCOPY WITH BIOPSY;  Surgeon: Jinny Carmine, MD;  Location: [facility];  Service: Endoscopy;  Laterality: N/A;  Needs COVID testing   COLPOSCOPY  2014   HYSTERECTOMY ABDOMINAL WITH SALPINGECTOMY     POLYPECTOMY N/A 05/12/2021   Procedure: POLYPECTOMY;  Surgeon: Jinny Carmine, MD;  Location: [facility];  Service: Endoscopy;  Laterality: N/A;    SOCIAL HISTORY: Social History   Socioeconomic History   Marital status: Divorced    Spouse name: Not on file   Number of children: Not on file   Years of education: Not on file   Highest education level: Not on file  Occupational History   Not on file  Tobacco Use   Smoking status: Former   Smokeless  tobacco: Never  Vaping Use   Vaping status: Every Day   Substances: Nicotine  Substance and Sexual Activity   Alcohol use: Not Currently   Drug use: Never   Sexual activity: Not Currently    Birth control/protection: None  Other Topics Concern   Not on file  Social History Narrative   Not on file   Social Drivers of Health   Financial Resource Strain: Medium Risk (08/11/2024)   Overall Financial Resource Strain (CARDIA)    Difficulty of Paying Living Expenses: Somewhat hard  Food Insecurity: Food Insecurity Present (  08/11/2024)   Hunger Vital Sign    Worried About Running Out of Food in the Last Year: Sometimes true    Ran Out of Food in the Last Year: Sometimes true  Transportation Needs: No Transportation Needs (08/11/2024)   PRAPARE - Administrator, Civil Service (Medical): No    Lack of Transportation (Non-Medical): No  Physical Activity: Not on file  Stress: Stress Concern Present (08/11/2024)   [employer] of Occupational Health - Occupational Stress Questionnaire    Feeling of Stress: To some extent  Social Connections: Not on file  Intimate Partner Violence: Not At Risk (08/11/2024)   Humiliation, Afraid, Rape, and Kick questionnaire    Fear of Current or Ex-Partner: No    Emotionally Abused: No    Physically Abused: No    Sexually Abused: No    FAMILY HISTORY: Family History  Problem Relation Age of Onset   Uterine cancer Mother    Stroke Sister    Breast cancer Sister    Stroke Brother    Colon cancer Brother    Stroke Brother    Lung cancer Brother    Skin cancer Brother     ALLERGIES:  has no known allergies.  MEDICATIONS:  Current Outpatient Medications  Medication Sig Dispense Refill   albuterol  (PROVENTIL ) (2.5 MG/3ML) 0.083% nebulizer solution Inhale 2.5 mg into the lungs.     albuterol  (VENTOLIN  HFA) 108 (90 Base) MCG/ACT inhaler Inhale 2 puffs into the lungs.     atorvastatin (LIPITOR) 40 MG tablet Take 40 mg by mouth daily.      Blood Glucose Monitoring Suppl (GLUCOCOM BLOOD GLUCOSE MONITOR) DEVI Please provide patient with glucometer covered by her insurance, preferably [employer].     citalopram (CELEXA) 20 MG tablet Take 20 mg by mouth daily.     enalapril-hydrochlorothiazide (VASERETIC) 10-25 MG tablet Take 1 tablet by mouth daily.     fluticasone (FLONASE) 50 MCG/ACT nasal spray Place 1 spray into both nostrils daily as needed for allergies or rhinitis.     ONE TOUCH ULTRA TEST test strip Apply 1 strip topically 3 (three) times daily as needed.  11   No current facility-administered medications for this visit.    Review of Systems  Constitutional:  Negative for appetite change, chills, fatigue and fever.  HENT:   Negative for hearing loss and voice change.   Eyes:  Negative for eye problems.  Respiratory:  Negative for chest tightness and cough.   Cardiovascular:  Negative for chest pain.  Gastrointestinal:  Negative for abdominal distention and blood in stool.       Epigastric pain  Endocrine: Negative for hot flashes.  Genitourinary:  Negative for difficulty urinating and frequency.   Musculoskeletal:  Negative for arthralgias.  Skin:  Negative for itching and rash.  Neurological:  Negative for extremity weakness.  Hematological:  Negative for adenopathy.  Psychiatric/Behavioral:  Negative for confusion.    PHYSICAL EXAMINATION:  Vitals:   08/11/24 1219 08/11/24 1241  BP: (!) 181/87 (!) 184/82  Pulse: 77   Resp: 18   Temp: 98.8 F (37.1 C)   SpO2: 97%    Filed Weights   08/11/24 1219  Weight: 173 lb 11.2 oz (78.8 kg)    Physical Exam Constitutional:      General: She is not in acute distress. HENT:     Head: Normocephalic and atraumatic.  Eyes:     General: No scleral icterus. Cardiovascular:     Rate and Rhythm: Normal  rate and regular rhythm.     Heart sounds: Normal heart sounds.  Pulmonary:     Effort: Pulmonary effort is normal. No respiratory distress.     Breath  sounds: No wheezing.  Abdominal:     General: Bowel sounds are normal. There is no distension.     Palpations: Abdomen is soft.  Musculoskeletal:        General: No deformity. Normal range of motion.     Cervical back: Normal range of motion and neck supple.  Skin:    General: Skin is warm and dry.     Findings: No erythema or rash.  Neurological:     Mental Status: She is alert and oriented to person, place, and time. Mental status is at baseline.     Cranial Nerves: No cranial nerve deficit.     Coordination: Coordination normal.  Psychiatric:        Mood and Affect: Mood normal.     LABORATORY DATA:  I have reviewed the data as listed    Latest Ref Rng & Units 06/27/2024    2:20 PM  CBC  WBC 4.0 - 10.5 K/uL 9.4   Hemoglobin 12.0 - 15.0 g/dL 88.4   Hematocrit 63.9 - 46.0 % 35.2   Platelets 150 - 400 K/uL 459       Latest Ref Rng & Units 06/27/2024    2:20 PM  CMP  Glucose 70 - 99 mg/dL 890   BUN 8 - 23 mg/dL 12   Creatinine 9.55 - 1.00 mg/dL 9.43   Sodium 864 - 854 mmol/L 140   Potassium 3.5 - 5.1 mmol/L 3.7   Chloride 98 - 111 mmol/L 106   CO2 22 - 32 mmol/L 21   Calcium 8.9 - 10.3 mg/dL 9.5       RADIOGRAPHIC STUDIES: I have personally reviewed the radiological images as listed and agreed with the findings in the report. MM Breast Surgical Specimen Result Date: 06/28/2024 CLINICAL DATA:  Status post 2 site wire localization EXAM: SPECIMEN RADIOGRAPH OF THE LEFT BREAST COMPARISON:  Previous exam(s). FINDINGS: Status post excision of the LEFT breast. The 2 wires, heart clip and COIL biopsy marker clip are present and are marked for pathology. IMPRESSION: Specimen radiograph of the LEFT breast. Electronically Signed   By: Corean Salter M.D.   On: 06/28/2024 14:14   MM 3D DIAGNOSTIC MAMMOGRAM UNILATERAL LEFT BREAST Result Date: 06/28/2024 CLINICAL DATA:  Status post 2 site wire localization of the LEFT breast EXAM: DIAGNOSTIC LEFT MAMMOGRAM POST PLACEMENT OF  SURGICAL WIRES COMPARISON:  Previous exam(s). ACR Breast Density Category b: There are scattered areas of fibroglandular density. FINDINGS: Mammographic images were obtained following ultrasound guided placement of a 5 cm Kopan wire in the LEFT breast at 12 o'clock 3 cm from the nipple/COIL clip. This demonstrates the reinforced portion of the wire centered at the COIL clip. Mammographic images were obtained following ultrasound guided placement of a 5 cm Kopan wire in the LEFT breast. This demonstrates the heart clip along the proximal reinforced portion of the wire. IMPRESSION: Status post 2 site wire localization with 5 cm wires as described above. Final Assessment: Post Procedure Mammograms for wire placement Electronically Signed   By: Corean Salter M.D.   On: 06/28/2024 09:12   US  LT PLC BREAST LOC DEV   1ST LESION  INC US  GUIDE Result Date: 06/28/2024 CLINICAL DATA:  Patient is status post 2 site ultrasound-guided biopsy which demonstrated papilloma heart) and atypical ductal hyperplasia (  COIL). Patient presents for 2 site wire localization EXAM: NEEDLE LOCALIZATION OF THE LEFT BREAST WITH ULTRASOUND GUIDANCE x2 COMPARISON:  Previous exam(s). FINDINGS: Patient presents for needle localization prior to surgical excision. I met with the patient and we discussed the procedure of needle localization including benefits and alternatives. We discussed the high likelihood of a successful procedure. We discussed the risks of the procedure, including infection, bleeding, tissue injury, and further surgery. Informed, written consent was given. The usual time-out protocol was performed immediately prior to the procedure. Using ultrasound guidance, sterile technique, 1% lidocaine  and a 5 cm modified Kopans needle, the COIL biopsy marking clip at 12 o'clock 3 cm from the nipple was localized using a lateral approach. Using ultrasound guidance, sterile technique, 1% lidocaine  and a 5 cm modified Kopans needle, the  mass at 1 o'clock 4 cm from the nipple was localized using a lateral approach. Subsequent two-view mammogram was obtained. The images were marked for Dr. Rodolph. IMPRESSION: Needle localization of the LEFT breast at 2 sites. No apparent complications. Electronically Signed   By: Corean Salter M.D.   On: 06/28/2024 08:29   US  LT PLC BREAST LOC DEV   EA ADD LESION  INC US  GUIDE Result Date: 06/28/2024 CLINICAL DATA:  Patient is status post 2 site ultrasound-guided biopsy which demonstrated papilloma heart) and atypical ductal hyperplasia (COIL). Patient presents for 2 site wire localization EXAM: NEEDLE LOCALIZATION OF THE LEFT BREAST WITH ULTRASOUND GUIDANCE x2 COMPARISON:  Previous exam(s). FINDINGS: Patient presents for needle localization prior to surgical excision. I met with the patient and we discussed the procedure of needle localization including benefits and alternatives. We discussed the high likelihood of a successful procedure. We discussed the risks of the procedure, including infection, bleeding, tissue injury, and further surgery. Informed, written consent was given. The usual time-out protocol was performed immediately prior to the procedure. Using ultrasound guidance, sterile technique, 1% lidocaine  and a 5 cm modified Kopans needle, the COIL biopsy marking clip at 12 o'clock 3 cm from the nipple was localized using a lateral approach. Using ultrasound guidance, sterile technique, 1% lidocaine  and a 5 cm modified Kopans needle, the mass at 1 o'clock 4 cm from the nipple was localized using a lateral approach. Subsequent two-view mammogram was obtained. The images were marked for Dr. Rodolph. IMPRESSION: Needle localization of the LEFT breast at 2 sites. No apparent complications. Electronically Signed   By: Corean Salter M.D.   On: 06/28/2024 08:29   US  LT BREAST BX W LOC DEV 1ST LESION IMG BX SPEC US  GUIDE Addendum Date: 06/09/2024 ADDENDUM REPORT: 06/09/2024 15:08  ADDENDUM: Pathology revealed INTRADUCTAL PAPILLOMA of the LEFT breast, 1 o'clock, 4cmfn, (heart clip). This was found to be concordant by Dr. Corean Salter, with surgical consultation for consideration of excision recommended. Pathology revealed COMPLEX CLOSING LESION (2 MM), DETACHED FRAGMENT OF PAPILLARY PROLIFERATION WITH FEATURES SUGGESTIVE OF INTRADUCTAL PAPILLOMA WITH ATYPICAL DUCTAL HYPERPLASIA (1 MM) of the LEFT breast, 12 o'clock, 3cmfn, (coil clip). This was found to be concordant by Dr. Corean Salter, with surgical consultation for consideration of excision recommended. Pathology results were discussed with the patient by telephone. The patient reported doing well after the biopsies with moderate tenderness at the sites. Post biopsy instructions and care were reviewed and questions were answered. The patient was encouraged to call [facility] for any additional concerns. My direct phone number and [place] phone number was provided. A surgical referral as sent to Shasta Ada RN, Nurse Navigator  via secure EPIC message on June 09, 2024. Pathology results reported by Hendricks Benders, RN on 06/09/2024. Electronically Signed   By: Corean Salter M.D.   On: 06/09/2024 15:08   Result Date: 06/09/2024 CLINICAL DATA:  Screening recall for LEFT breast mass. Patient presents for ultrasound-guided biopsy of 2 masses and ultrasound-guided aspect potential conversion to biopsy of an additional mass. EXAM: ULTRASOUND GUIDED LEFT BREAST CORE NEEDLE BIOPSY x2 ULTRASOUND GUIDED LEFT BREAST CYST ASPIRATION COMPARISON:  Previous exam(s). PROCEDURE: I met with the patient and we discussed the procedure of ultrasound-guided biopsy, including benefits and alternatives. We discussed the high likelihood of a successful procedure. We discussed the risks of the procedure, including infection, bleeding, tissue injury, clip migration, and inadequate sampling. Informed written consent was given. The usual time-out  protocol was performed immediately prior to the procedure. Site 1: 1 o'clock 4 cm from the nipple Lesion quadrant: Upper outer quadrant Using sterile technique and 1% lidocaine  and 1% lidocaine  with epinephrine  as local anesthetic, under direct ultrasound visualization, a 14 gauge spring-loaded device was used to perform biopsy of a mass at 1 o'clock using a lateral approach. At the conclusion of the procedure a heart shaped tissue marker clip was deployed into the biopsy cavity. Follow up 2 view mammogram was performed and dictated separately. Site 2: 12 o'clock 3 cm from the nipple Lesion quadrant: Upper outer quadrant Using sterile technique and 1% lidocaine  and 1% lidocaine  with epinephrine  as local anesthetic, under direct ultrasound visualization, a 14 gauge spring-loaded device was used to perform biopsy of a mass at 12 o'clock using a lateral approach. At the conclusion of the procedure a COIL shaped tissue marker clip was deployed into the biopsy cavity. Follow up 2 view mammogram was performed and dictated separately. Site 3: 1 o'clock 8 cm from the nipple ULTRASOUND GUIDED LEFT BREAST CYST ASPIRATION The patient and I discussed the procedure of ultrasound-guided aspiration with potential conversion to biopsy including benefits and alternatives. We discussed the high likelihood of a successful procedure. We discussed the risks of the procedure including infection, bleeding, tissue injury, and inadequate sampling. Informed written consent was given. The usual time out protocol was performed immediately prior to the procedure. Using sterile technique, 1% lidocaine , under direct ultrasound visualization, needle aspiration of a mass at 1 o'clock 8 cm from the nipple was performed. Mass aspirated to completion. Aspirated fluid has a benign appearance so was discarded. IMPRESSION: 1. Ultrasound guided biopsy of a mass at 1 o'clock 4 cm from the nipple and 12 o'clock 3 cm from the nipple. No apparent  complications. 2. Ultrasound-guided aspiration of a benign cyst at 1 o'clock 8 cm from the nipple. No apparent complications. Electronically Signed: By: Corean Salter M.D. On: 06/08/2024 10:02   US  LT BREAST BX W LOC DEV EA ADD LESION IMG BX SPEC US  GUIDE Addendum Date: 06/09/2024 ADDENDUM REPORT: 06/09/2024 15:08 ADDENDUM: Pathology revealed INTRADUCTAL PAPILLOMA of the LEFT breast, 1 o'clock, 4cmfn, (heart clip). This was found to be concordant by Dr. Corean Salter, with surgical consultation for consideration of excision recommended. Pathology revealed COMPLEX CLOSING LESION (2 MM), DETACHED FRAGMENT OF PAPILLARY PROLIFERATION WITH FEATURES SUGGESTIVE OF INTRADUCTAL PAPILLOMA WITH ATYPICAL DUCTAL HYPERPLASIA (1 MM) of the LEFT breast, 12 o'clock, 3cmfn, (coil clip). This was found to be concordant by Dr. Corean Salter, with surgical consultation for consideration of excision recommended. Pathology results were discussed with the patient by telephone. The patient reported doing well after the biopsies with moderate tenderness at  the sites. Post biopsy instructions and care were reviewed and questions were answered. The patient was encouraged to call [facility] for any additional concerns. My direct phone number and [place] phone number was provided. A surgical referral as sent to Shasta Ada RN, Nurse Navigator via secure EPIC message on June 09, 2024. Pathology results reported by Hendricks Benders, RN on 06/09/2024. Electronically Signed   By: Corean Salter M.D.   On: 06/09/2024 15:08   Result Date: 06/09/2024 CLINICAL DATA:  Screening recall for LEFT breast mass. Patient presents for ultrasound-guided biopsy of 2 masses and ultrasound-guided aspect potential conversion to biopsy of an additional mass. EXAM: ULTRASOUND GUIDED LEFT BREAST CORE NEEDLE BIOPSY x2 ULTRASOUND GUIDED LEFT BREAST CYST ASPIRATION COMPARISON:  Previous exam(s). PROCEDURE: I met with the patient and we discussed  the procedure of ultrasound-guided biopsy, including benefits and alternatives. We discussed the high likelihood of a successful procedure. We discussed the risks of the procedure, including infection, bleeding, tissue injury, clip migration, and inadequate sampling. Informed written consent was given. The usual time-out protocol was performed immediately prior to the procedure. Site 1: 1 o'clock 4 cm from the nipple Lesion quadrant: Upper outer quadrant Using sterile technique and 1% lidocaine  and 1% lidocaine  with epinephrine  as local anesthetic, under direct ultrasound visualization, a 14 gauge spring-loaded device was used to perform biopsy of a mass at 1 o'clock using a lateral approach. At the conclusion of the procedure a heart shaped tissue marker clip was deployed into the biopsy cavity. Follow up 2 view mammogram was performed and dictated separately. Site 2: 12 o'clock 3 cm from the nipple Lesion quadrant: Upper outer quadrant Using sterile technique and 1% lidocaine  and 1% lidocaine  with epinephrine  as local anesthetic, under direct ultrasound visualization, a 14 gauge spring-loaded device was used to perform biopsy of a mass at 12 o'clock using a lateral approach. At the conclusion of the procedure a COIL shaped tissue marker clip was deployed into the biopsy cavity. Follow up 2 view mammogram was performed and dictated separately. Site 3: 1 o'clock 8 cm from the nipple ULTRASOUND GUIDED LEFT BREAST CYST ASPIRATION The patient and I discussed the procedure of ultrasound-guided aspiration with potential conversion to biopsy including benefits and alternatives. We discussed the high likelihood of a successful procedure. We discussed the risks of the procedure including infection, bleeding, tissue injury, and inadequate sampling. Informed written consent was given. The usual time out protocol was performed immediately prior to the procedure. Using sterile technique, 1% lidocaine , under direct ultrasound  visualization, needle aspiration of a mass at 1 o'clock 8 cm from the nipple was performed. Mass aspirated to completion. Aspirated fluid has a benign appearance so was discarded. IMPRESSION: 1. Ultrasound guided biopsy of a mass at 1 o'clock 4 cm from the nipple and 12 o'clock 3 cm from the nipple. No apparent complications. 2. Ultrasound-guided aspiration of a benign cyst at 1 o'clock 8 cm from the nipple. No apparent complications. Electronically Signed: By: Corean Salter M.D. On: 06/08/2024 10:02   US  BREAST ASPIRATION LEFT Addendum Date: 06/09/2024 ADDENDUM REPORT: 06/09/2024 15:08 ADDENDUM: Pathology revealed INTRADUCTAL PAPILLOMA of the LEFT breast, 1 o'clock, 4cmfn, (heart clip). This was found to be concordant by Dr. Corean Salter, with surgical consultation for consideration of excision recommended. Pathology revealed COMPLEX CLOSING LESION (2 MM), DETACHED FRAGMENT OF PAPILLARY PROLIFERATION WITH FEATURES SUGGESTIVE OF INTRADUCTAL PAPILLOMA WITH ATYPICAL DUCTAL HYPERPLASIA (1 MM) of the LEFT breast, 12 o'clock, 3cmfn, (coil clip). This was  found to be concordant by Dr. Corean Salter, with surgical consultation for consideration of excision recommended. Pathology results were discussed with the patient by telephone. The patient reported doing well after the biopsies with moderate tenderness at the sites. Post biopsy instructions and care were reviewed and questions were answered. The patient was encouraged to call [facility] for any additional concerns. My direct phone number and [place] phone number was provided. A surgical referral as sent to Shasta Ada RN, Nurse Navigator via secure EPIC message on June 09, 2024. Pathology results reported by Hendricks Benders, RN on 06/09/2024. Electronically Signed   By: Corean Salter M.D.   On: 06/09/2024 15:08   Result Date: 06/09/2024 CLINICAL DATA:  Screening recall for LEFT breast mass. Patient presents for ultrasound-guided biopsy of  2 masses and ultrasound-guided aspect potential conversion to biopsy of an additional mass. EXAM: ULTRASOUND GUIDED LEFT BREAST CORE NEEDLE BIOPSY x2 ULTRASOUND GUIDED LEFT BREAST CYST ASPIRATION COMPARISON:  Previous exam(s). PROCEDURE: I met with the patient and we discussed the procedure of ultrasound-guided biopsy, including benefits and alternatives. We discussed the high likelihood of a successful procedure. We discussed the risks of the procedure, including infection, bleeding, tissue injury, clip migration, and inadequate sampling. Informed written consent was given. The usual time-out protocol was performed immediately prior to the procedure. Site 1: 1 o'clock 4 cm from the nipple Lesion quadrant: Upper outer quadrant Using sterile technique and 1% lidocaine  and 1% lidocaine  with epinephrine  as local anesthetic, under direct ultrasound visualization, a 14 gauge spring-loaded device was used to perform biopsy of a mass at 1 o'clock using a lateral approach. At the conclusion of the procedure a heart shaped tissue marker clip was deployed into the biopsy cavity. Follow up 2 view mammogram was performed and dictated separately. Site 2: 12 o'clock 3 cm from the nipple Lesion quadrant: Upper outer quadrant Using sterile technique and 1% lidocaine  and 1% lidocaine  with epinephrine  as local anesthetic, under direct ultrasound visualization, a 14 gauge spring-loaded device was used to perform biopsy of a mass at 12 o'clock using a lateral approach. At the conclusion of the procedure a COIL shaped tissue marker clip was deployed into the biopsy cavity. Follow up 2 view mammogram was performed and dictated separately. Site 3: 1 o'clock 8 cm from the nipple ULTRASOUND GUIDED LEFT BREAST CYST ASPIRATION The patient and I discussed the procedure of ultrasound-guided aspiration with potential conversion to biopsy including benefits and alternatives. We discussed the high likelihood of a successful procedure. We discussed  the risks of the procedure including infection, bleeding, tissue injury, and inadequate sampling. Informed written consent was given. The usual time out protocol was performed immediately prior to the procedure. Using sterile technique, 1% lidocaine , under direct ultrasound visualization, needle aspiration of a mass at 1 o'clock 8 cm from the nipple was performed. Mass aspirated to completion. Aspirated fluid has a benign appearance so was discarded. IMPRESSION: 1. Ultrasound guided biopsy of a mass at 1 o'clock 4 cm from the nipple and 12 o'clock 3 cm from the nipple. No apparent complications. 2. Ultrasound-guided aspiration of a benign cyst at 1 o'clock 8 cm from the nipple. No apparent complications. Electronically Signed: By: Corean Salter M.D. On: 06/08/2024 10:02   MM CLIP PLACEMENT LEFT Result Date: 06/08/2024 CLINICAL DATA:  Status post 2 site ultrasound-guided biopsy EXAM: 3D DIAGNOSTIC LEFT MAMMOGRAM POST ULTRASOUND BIOPSY COMPARISON:  Previous exam(s). ACR Breast Density Category b: There are scattered areas of fibroglandular density. FINDINGS:  3D Mammographic images were obtained following ultrasound guided biopsy of a mass at 1 o'clock. The heart biopsy marking clip is in expected position at the site of biopsy. 3D Mammographic images were obtained following ultrasound guided biopsy of a mass at 12 o'clock. The COIL biopsy marking clip is in expected position at the site of biopsy. Clips span approximately 2.5 cm. IMPRESSION: 1. Appropriate positioning of the heart shaped biopsy marking clip at the site of biopsy in the upper-outer breast. 2. Appropriate positioning of the COIL shaped biopsy marking clip at the site of biopsy in the upper breast. Final Assessment: Post Procedure Mammograms for Marker Placement Electronically Signed   By: Corean Salter M.D.   On: 06/08/2024 09:57   MM 3D DIAGNOSTIC MAMMOGRAM UNILATERAL LEFT BREAST Result Date: 05/29/2024 CLINICAL DATA:  LEFT breast  callback EXAM: DIGITAL DIAGNOSTIC UNILATERAL LEFT MAMMOGRAM WITH TOMOSYNTHESIS AND CAD; ULTRASOUND LEFT BREAST LIMITED TECHNIQUE: Left digital diagnostic mammography and breast tomosynthesis was performed. The images were evaluated with computer-aided detection. ; Targeted ultrasound examination of the left breast was performed. COMPARISON:  Previous exam(s). ACR Breast Density Category b: There are scattered areas of fibroglandular density. FINDINGS: Spot compression tomosynthesis views demonstrates a persistent high density oval mass in the LEFT upper outer breast at anterior depth. This is best seen on spot CC slice 36, spot MLO volume 1 slice 42 and ML slice 44. There is possible subtle associated architectural distortion. In addition there is an adjacent low-density oval mass noted in the upper breast at anterior depth, best seen on spot CC slice 34. Questioned asymmetry in the upper outer breast at middle to posterior depth partially effaces with additional views, most consistent with overlapping tissue. There is a suggestion of several oval circumscribed masses scattered throughout this area. On physical exam, there is firmness in the LEFT upper outer breast. Targeted ultrasound was performed LEFT breast. At 1 o'clock 4 cm from the nipple, there is an oval hypoechoic mass with irregular margins. It measures 10 x 7 by 9 mm. This corresponds to the high density mass noted mammographically. Adjacent at 12 o'clock 3 cm from the nipple, there is an oval hypoechoic mass with some internal anechoic portions. This measures 6 x 4 x 5 mm. This is favored to correspond to the adjacent low-density mass. Targeted ultrasound was performed LEFT upper outer breast. At 1 o'clock 8 cm from nipple there is an oval hypoechoic mass with circumscribed margins. It demonstrates a suggestion of posterior acoustic enhancement. It measures 3 x 4 x 5 mm. Multiple adjacent tiny subcentimeter benign cysts are noted during real-time  examination. Targeted ultrasound was performed of the LEFT axilla. No suspicious axillary lymph nodes are visualized. IMPRESSION: 1. There is a 10 mm indeterminate mass at the site of screening mammographic concern. Recommend ultrasound-guided biopsy for definitive characterization. 2. There is an adjacent indeterminate 6 mm possible complex cystic and solid versus complicated cystic mass at 12 o'clock. Recommend ultrasound-guided biopsy for definitive characterization. 3. There is an indeterminate 5 mm mass at 1 o'clock 8 cm from the nipple which may reflect a complicated cyst. Recommend ultrasound-guided aspiration with potential conversion to biopsy for definitive characterization. 4. No suspicious LEFT axillary adenopathy. RECOMMENDATION: 1. LEFT breast ultrasound-guided biopsy x2 2. LEFT breast ultrasound-guided aspiration with potential conversion to biopsy x1 I have discussed the findings and recommendations with the patient. The biopsy procedure was discussed with the patient and questions were answered. Patient expressed their understanding of the biopsy recommendation. Patient will  be scheduled for biopsy at her earliest convenience by the schedulers. Ordering provider will be notified. If applicable, a reminder letter will be sent to the patient regarding the next appointment. BI-RADS CATEGORY  4: Suspicious. Electronically Signed   By: Corean Salter M.D.   On: 05/29/2024 15:37   US  LIMITED ULTRASOUND INCLUDING AXILLA LEFT BREAST  Result Date: 05/29/2024 CLINICAL DATA:  LEFT breast callback EXAM: DIGITAL DIAGNOSTIC UNILATERAL LEFT MAMMOGRAM WITH TOMOSYNTHESIS AND CAD; ULTRASOUND LEFT BREAST LIMITED TECHNIQUE: Left digital diagnostic mammography and breast tomosynthesis was performed. The images were evaluated with computer-aided detection. ; Targeted ultrasound examination of the left breast was performed. COMPARISON:  Previous exam(s). ACR Breast Density Category b: There are scattered areas of  fibroglandular density. FINDINGS: Spot compression tomosynthesis views demonstrates a persistent high density oval mass in the LEFT upper outer breast at anterior depth. This is best seen on spot CC slice 36, spot MLO volume 1 slice 42 and ML slice 44. There is possible subtle associated architectural distortion. In addition there is an adjacent low-density oval mass noted in the upper breast at anterior depth, best seen on spot CC slice 34. Questioned asymmetry in the upper outer breast at middle to posterior depth partially effaces with additional views, most consistent with overlapping tissue. There is a suggestion of several oval circumscribed masses scattered throughout this area. On physical exam, there is firmness in the LEFT upper outer breast. Targeted ultrasound was performed LEFT breast. At 1 o'clock 4 cm from the nipple, there is an oval hypoechoic mass with irregular margins. It measures 10 x 7 by 9 mm. This corresponds to the high density mass noted mammographically. Adjacent at 12 o'clock 3 cm from the nipple, there is an oval hypoechoic mass with some internal anechoic portions. This measures 6 x 4 x 5 mm. This is favored to correspond to the adjacent low-density mass. Targeted ultrasound was performed LEFT upper outer breast. At 1 o'clock 8 cm from nipple there is an oval hypoechoic mass with circumscribed margins. It demonstrates a suggestion of posterior acoustic enhancement. It measures 3 x 4 x 5 mm. Multiple adjacent tiny subcentimeter benign cysts are noted during real-time examination. Targeted ultrasound was performed of the LEFT axilla. No suspicious axillary lymph nodes are visualized. IMPRESSION: 1. There is a 10 mm indeterminate mass at the site of screening mammographic concern. Recommend ultrasound-guided biopsy for definitive characterization. 2. There is an adjacent indeterminate 6 mm possible complex cystic and solid versus complicated cystic mass at 12 o'clock. Recommend  ultrasound-guided biopsy for definitive characterization. 3. There is an indeterminate 5 mm mass at 1 o'clock 8 cm from the nipple which may reflect a complicated cyst. Recommend ultrasound-guided aspiration with potential conversion to biopsy for definitive characterization. 4. No suspicious LEFT axillary adenopathy. RECOMMENDATION: 1. LEFT breast ultrasound-guided biopsy x2 2. LEFT breast ultrasound-guided aspiration with potential conversion to biopsy x1 I have discussed the findings and recommendations with the patient. The biopsy procedure was discussed with the patient and questions were answered. Patient expressed their understanding of the biopsy recommendation. Patient will be scheduled for biopsy at her earliest convenience by the schedulers. Ordering provider will be notified. If applicable, a reminder letter will be sent to the patient regarding the next appointment. BI-RADS CATEGORY  4: Suspicious. Electronically Signed   By: Corean Salter M.D.   On: 05/29/2024 15:37   MM 3D SCREENING MAMMOGRAM BILATERAL BREAST Result Date: 05/24/2024 CLINICAL DATA:  Screening. EXAM: DIGITAL SCREENING BILATERAL MAMMOGRAM WITH TOMOSYNTHESIS AND  CAD TECHNIQUE: Bilateral screening digital craniocaudal and mediolateral oblique mammograms were obtained. Bilateral screening digital breast tomosynthesis was performed. The images were evaluated with computer-aided detection. COMPARISON:  Previous exam(s). ACR Breast Density Category b: There are scattered areas of fibroglandular density. FINDINGS: In the left breast, an asymmetry as well as a possible mass warrants further evaluation. In the right breast, no findings suspicious for malignancy. IMPRESSION: Further evaluation is suggested for an asymmetry as well as a possible mass in the left breast. RECOMMENDATION: Diagnostic mammogram and possibly ultrasound of the left breast. (Code:[id number]-L-70M) The patient will be contacted regarding the findings, and additional imaging  will be scheduled. BI-RADS CATEGORY  0: Incomplete: Need additional imaging evaluation. Electronically Signed   By: Toribio Agreste M.D.   On: 05/24/2024 12:53

## 2024-08-12 NOTE — Assessment & Plan Note (Addendum)
 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 Recommendation  Active Surveillance: life time annual screening mammography  Option of endocrine therapy as chemoprevention was discussed. -Obtain baseline bone density Refer to genetic counselor.  If lifetime breast cancer risk is above the 20% consider MRI alternating with mammogram for surveillance.

## 2024-08-12 NOTE — Assessment & Plan Note (Signed)
 Refer to genetic counselor

## 2024-08-16 ENCOUNTER — Inpatient Hospital Stay: Admitting: Licensed Clinical Social Worker

## 2024-08-16 NOTE — Progress Notes (Signed)
 [facility] Clinical Social Work  Clinical Social Work was referred by medical provider for need for community resources.  Clinical Social Worker contacted patient by phone to offer support and assess for needs.     Interventions: Referred patient to community resources: food banks and [employer]  Also included CSW contact information. Patient not currently receiving treatment and is not eligible for the [place] fund.      Follow Up Plan:  Patient will contact CSW with any support or resource needs    Macario CHRISTELLA Au, LCSW  Clinical Social Worker [facility]

## 2024-08-23 ENCOUNTER — Other Ambulatory Visit: Payer: Self-pay

## 2024-08-23 DIAGNOSIS — Z1379 Encounter for other screening for genetic and chromosomal anomalies: Secondary | ICD-10-CM

## 2024-08-24 ENCOUNTER — Inpatient Hospital Stay: Admitting: Licensed Clinical Social Worker

## 2024-08-24 ENCOUNTER — Inpatient Hospital Stay

## 2024-08-24 ENCOUNTER — Encounter: Payer: Self-pay | Admitting: Licensed Clinical Social Worker

## 2024-08-24 DIAGNOSIS — Z8049 Family history of malignant neoplasm of other genital organs: Secondary | ICD-10-CM

## 2024-08-24 DIAGNOSIS — N6002 Solitary cyst of left breast: Secondary | ICD-10-CM | POA: Diagnosis not present

## 2024-08-24 DIAGNOSIS — Z803 Family history of malignant neoplasm of breast: Secondary | ICD-10-CM

## 2024-08-24 NOTE — Progress Notes (Signed)
 REFERRING PROVIDER: Babara Call, MD [address] [place],  [place] [address]  PRIMARY PROVIDER:  Donnie Handing, [place]  PRIMARY REASON FOR VISIT:  1. Family history of breast cancer   2. Family history of uterine cancer      HISTORY OF PRESENT ILLNESS:   [name], a 68 y.o. female, was seen for a [place] cancer genetics consultation at the request of Dr. Babara due to a family history of cancer.  [name] presents to clinic today to discuss the possibility of a hereditary predisposition to cancer, genetic testing, and to further clarify her future cancer risks, as well as potential cancer risks for family members.   CANCER HISTORY:  [name] is a 68 y.o. female with no personal history of cancer.    RELEVANT MEDICAL HISTORY:  Menarche was at age 84.  First live birth at age 46.   Ovaries intact: no.  Hysterectomy: yes. - TAH BSO in 83 (age 35) Menopausal status: postmenopausal.  HRT use: 0 years. Colonoscopy: yes; few polyps in 2022. Mammogram within the last year: yes. 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   OSA (obstructive sleep apnea) 08/28/2015   Polyp of descending colon    S/P TAH-BSO 08/13/2011   Sleep apnea    no CPAP   Tinnitus 08/13/2011   Wears partial dentures    upper    Past Surgical History:  Procedure Laterality Date   BREAST BIOPSY Left 06/08/2024   US  LT BREAST BX W LOC DEV 1ST LESION IMG BX SPEC US  GUIDE 06/08/2024 [facility]   BREAST BIOPSY Left 06/08/2024   US  LT BREAST BX W LOC DEV EA ADD LESION IMG BX SPEC US  GUIDE 06/08/2024 [facility]   BREAST BIOPSY Left  06/28/2024   US  LT PLC BREAST LOC DEV   1ST LESION  INC US  GUIDE 06/28/2024 [facility]   BREAST BIOPSY Left 06/28/2024   US  LT PLC BREAST LOC DEV   EA ADD LESION  INC US  GUIDE 06/28/2024 [facility]   BREAST LUMPECTOMY Left 06/28/2024   Procedure: BREAST LUMPECTOMY;  Surgeon: Rodolph Romano, MD;  Location: [facility];  Service: General;  Laterality: Left;  WITH NEEDLE LOCALIZATION   CESAREAN SECTION     COLONOSCOPY WITH PROPOFOL  N/A 05/12/2021   Procedure: COLONOSCOPY WITH BIOPSY;  Surgeon: Jinny Carmine, MD;  [facility];  Service: Endoscopy;  Laterality: N/A;  Needs COVID testing   COLPOSCOPY  2014   HYSTERECTOMY ABDOMINAL WITH SALPINGECTOMY     POLYPECTOMY N/A 05/12/2021   Procedure: POLYPECTOMY;  Surgeon: Jinny Carmine, MD;  Location: [facility];  Service: Endoscopy;  Laterality: N/A;    FAMILY HISTORY:  We obtained a detailed, 4-generation family history.  Significant diagnoses are listed below: Family History  Problem Relation Age of Onset   Uterine cancer Mother 67   Stroke Sister    Breast cancer Sister        dx late 63s   Stroke Brother    Cancer Brother        unsure type   Stroke Brother    Cancer [employer]  unk type, possibly lung   [name] has 2 sons, 50 and 19, and 2 daughters, 37 and 25. She had 3 brothers and 3 sisters. One brother died of cancer, unknown type but possibly colon, at age 51.  A sister was diagnosed with breast cancer in her 28s and died at 16. A nephew died of cancer, unknown type but possibly lung cancer.  [name]'s mother had uterine cancer at 31 and passed at 62. No other known cancers on this side of the family.  [name]'s father died at 68, she does not have information about this side of the family.  [name] is unaware of previous family history of genetic testing for hereditary cancer risks. There is no reported Ashkenazi Jewish ancestry. There is no known consanguinity.    GENETIC  COUNSELING ASSESSMENT: [name] is a 68 y.o. female with a family history which is not particularly suggestive of a hereditary cancer syndrome and predisposition to cancer. We, therefore, discussed and recommended the following at today's visit.   DISCUSSION: We discussed that approximately 10% of cancer is hereditary. Most cases of hereditary breast cancer are associated with BRCA1/BRCA2 genes, although there are other genes associated with hereditary cancer as well. Cancers and risks are gene specific. We discussed that testing is beneficial for several reasons including knowing about cancer risks, identifying potential screening and risk-reduction options that may be appropriate, and to understand if other family members could be at risk for cancer and allow them to undergo genetic testing.   We discussed with [name] that the family history does not meet insurance or NCCN criteria for genetic testing and, therefore, is not highly consistent with a familial hereditary cancer syndrome.  She is at low risk to harbor  a gene mutation associated with such a condition. Thus, we did not recommend any genetic testing at this time and recommended [name] continue to follow the cancer screening guidelines given by her primary healthcare provider. She could still pursue genetic testing for a self pay price of $249 ([facility]+RNA Panel) if she would like to.   Based on the patient's family history, a risk model, Beatrice Harvey, was used to estimate her risk of developing breast cancer. This estimates her lifetime risk of developing breast cancer to be approximately 19.9%. The patient's lifetime breast cancer risk is a preliminary estimate based on available information using one of several models endorsed by the American Cancer Society (ACS). The ACS recommends consideration of breast MRI screening as an adjunct to mammography for patients at high risk (defined as 20% or greater lifetime risk).   For  women with a greater than 20% lifetime risk of breast cancer, the NCCN recommends the following:    1.   Clinical encounter every 6-12 months to begin when identified as being at increased risk, but not before age 54    2.   Annual mammograms, tomosynthesis is recommended starting 10 years earlier than the youngest breast cancer diagnosis in the family or at age 42 (whichever comes first), but not before age 71     35.   Annual breast MRI starting 10 years earlier than the youngest breast cancer diagnosis in the family or at age 73 (whichever comes first), but not before age 86  We discussed that Ms. Pates should discuss her individual situation with her referring physician and determine a breast cancer screening plan with which they are both comfortable.       PLAN: After considering the  risks, benefits, and limitations, Ms. Stege did not wish to pursue genetic testing at today's visit. We understand this decision and remain available to coordinate genetic testing at any time in the future. We, therefore, recommend Ms. Dorian continue to follow the cancer screening guidelines given by her primary healthcare provider.  Ms. Shimamoto questions were answered to her satisfaction today. Our contact information was provided should additional questions or concerns arise. Thank you for the referral and allowing us  to share in the care of your patient.   Dena Cary, MS, [facility] Genetic Counselor [email or web address] Phone: [phone]  50 minutes were spent on the date of the encounter in service to the patient including preparation, face-to-face consultation, documentation and care coordination. Dr. Delinda was available for discussion regarding this case.   _______________________________________________________________________ For Office Staff:  Number of people involved in session: 1 Was an Intern/ student involved with case: no

## 2024-09-13 ENCOUNTER — Other Ambulatory Visit

## 2024-11-13 ENCOUNTER — Inpatient Hospital Stay: Attending: Oncology | Admitting: Oncology

## 2024-12-05 ENCOUNTER — Telehealth: Payer: Self-pay | Admitting: Oncology

## 2024-12-05 NOTE — Telephone Encounter (Signed)
 Referral from [facility] for pt to be seen here again.  Per Dr.Yu, MD followed by labs. Next available.   I called and left vm for pt, scheduling number provided, for pt to call back if she wants to schedule appts.
